# Patient Record
Sex: Male | Born: 2000 | Race: Black or African American | Hispanic: No | Marital: Single | Smoking: Current some day smoker
Health system: Southern US, Community
[De-identification: ages and names within clinical notes are randomized; demographics above are authoritative.]

## PROBLEM LIST (undated history)

## (undated) DIAGNOSIS — F329 Major depressive disorder, single episode, unspecified: Secondary | ICD-10-CM

## (undated) DIAGNOSIS — F419 Anxiety disorder, unspecified: Secondary | ICD-10-CM

## (undated) HISTORY — PX: HERNIA REPAIR: SHX51

---
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
## 2018-07-27 ENCOUNTER — Emergency Department (HOSPITAL_COMMUNITY)
Admission: EM | Admit: 2018-07-27 | Discharge: 2018-07-27 | Disposition: A | Discharge status: Home / Self Care | Payer: 59 | Attending: Emergency Medicine | Admitting: Emergency Medicine

## 2018-07-27 ENCOUNTER — Other Ambulatory Visit: Payer: Self-pay

## 2018-07-27 ENCOUNTER — Emergency Department (HOSPITAL_COMMUNITY): Payer: 59

## 2018-07-27 ENCOUNTER — Encounter (HOSPITAL_COMMUNITY): Payer: Self-pay | Admitting: Emergency Medicine

## 2018-07-27 DIAGNOSIS — I4891 Unspecified atrial fibrillation: Secondary | ICD-10-CM | POA: Diagnosis not present

## 2018-07-27 DIAGNOSIS — R197 Diarrhea, unspecified: Secondary | ICD-10-CM | POA: Insufficient documentation

## 2018-07-27 DIAGNOSIS — F1099 Alcohol use, unspecified with unspecified alcohol-induced disorder: Secondary | ICD-10-CM | POA: Diagnosis not present

## 2018-07-27 DIAGNOSIS — R112 Nausea with vomiting, unspecified: Secondary | ICD-10-CM

## 2018-07-27 DIAGNOSIS — Y900 Blood alcohol level of less than 20 mg/100 ml: Secondary | ICD-10-CM | POA: Insufficient documentation

## 2018-07-27 DIAGNOSIS — R1084 Generalized abdominal pain: Secondary | ICD-10-CM

## 2018-07-27 DIAGNOSIS — I480 Paroxysmal atrial fibrillation: Secondary | ICD-10-CM | POA: Diagnosis not present

## 2018-07-27 DIAGNOSIS — R0602 Shortness of breath: Secondary | ICD-10-CM | POA: Diagnosis present

## 2018-07-27 DIAGNOSIS — F17200 Nicotine dependence, unspecified, uncomplicated: Secondary | ICD-10-CM | POA: Diagnosis not present

## 2018-07-27 LAB — RAPID URINE DRUG SCREEN, HOSP PERFORMED
Amphetamines: NOT DETECTED
Barbiturates: NOT DETECTED
Benzodiazepines: NOT DETECTED
Cocaine: NOT DETECTED
Opiates: NOT DETECTED
Tetrahydrocannabinol: POSITIVE — AB

## 2018-07-27 LAB — COMPREHENSIVE METABOLIC PANEL
ALT: 22 U/L (ref 0–44)
AST: 29 U/L (ref 15–41)
Albumin: 4.4 g/dL (ref 3.5–5.0)
Alkaline Phosphatase: 59 U/L (ref 38–126)
Anion gap: 9 (ref 5–15)
BUN: 11 mg/dL (ref 6–20)
CO2: 24 mmol/L (ref 22–32)
Calcium: 9.2 mg/dL (ref 8.9–10.3)
Chloride: 107 mmol/L (ref 98–111)
Creatinine, Ser: 1.12 mg/dL (ref 0.61–1.24)
GFR calc Af Amer: >60 mL/min (ref >60)
GFR calc non Af Amer: >60 mL/min (ref >60)
Glucose, Bld: 108 mg/dL — ABNORMAL HIGH (ref 70–99)
Potassium: 4 mmol/L (ref 3.5–5.1)
SODIUM: 140 mmol/L (ref 135–145)
Total Bilirubin: 1.6 mg/dL — ABNORMAL HIGH (ref 0.3–1.2)
Total Protein: 7.3 g/dL (ref 6.5–8.1)

## 2018-07-27 LAB — URINALYSIS, ROUTINE W REFLEX MICROSCOPIC
Bilirubin Urine: NEGATIVE
GLUCOSE, UA: NEGATIVE mg/dL
Hgb urine dipstick: NEGATIVE
Ketones, ur: 5 mg/dL — AB
Leukocytes,Ua: NEGATIVE
Nitrite: NEGATIVE
Protein, ur: NEGATIVE mg/dL
Specific Gravity, Urine: >1.046 — ABNORMAL HIGH (ref 1.005–1.030)
pH: 9 — ABNORMAL HIGH (ref 5.0–8.0)

## 2018-07-27 LAB — ETHANOL: Alcohol, Ethyl (B): 11 mg/dL — ABNORMAL HIGH (ref <10)

## 2018-07-27 LAB — CBC
HCT: 43.9 % (ref 39.0–52.0)
Hemoglobin: 13.9 g/dL (ref 13.0–17.0)
MCH: 25.6 pg — ABNORMAL LOW (ref 26.0–34.0)
MCHC: 31.7 g/dL (ref 30.0–36.0)
MCV: 80.8 fL (ref 80.0–100.0)
PLATELETS: 239 10*3/uL (ref 150–400)
RBC: 5.43 MIL/uL (ref 4.22–5.81)
RDW: 13.9 % (ref 11.5–15.5)
WBC: 6.8 10*3/uL (ref 4.0–10.5)
nRBC: 0 % (ref 0.0–0.2)

## 2018-07-27 LAB — D-DIMER, QUANTITATIVE: D-Dimer, Quant: <0.27 ug/mL-FEU (ref 0.00–0.50)

## 2018-07-27 LAB — LIPASE, BLOOD: Lipase: 26 U/L (ref 11–51)

## 2018-07-27 MED ORDER — IOPAMIDOL (ISOVUE-370) INJECTION 76%
100.0000 mL | Freq: Once | INTRAVENOUS | Status: AC | PRN
  Administered 2018-07-27: 60 mL via INTRAVENOUS

## 2018-07-27 MED ORDER — SODIUM CHLORIDE 0.9 % IV BOLUS
1000.0000 mL | Freq: Once | INTRAVENOUS | Status: AC
  Administered 2018-07-27: 1000 mL via INTRAVENOUS

## 2018-07-27 MED ORDER — ONDANSETRON HCL 4 MG/2ML IJ SOLN
4.0000 mg | Freq: Once | INTRAMUSCULAR | Status: AC
  Administered 2018-07-27: 4 mg via INTRAVENOUS
  Filled 2018-07-27: qty 2

## 2018-07-27 MED ORDER — IOPAMIDOL (ISOVUE-300) INJECTION 61%
100.0000 mL | Freq: Once | INTRAVENOUS | Status: AC | PRN
  Administered 2018-07-27: 100 mL via INTRAVENOUS

## 2018-07-27 MED ORDER — SODIUM CHLORIDE 0.9% FLUSH: 3.0000 mL | Freq: Once | INTRAVENOUS | Status: DC

## 2018-07-27 MED ORDER — ONDANSETRON 4 MG PO TBDP: 4.0000 mg | ORAL_TABLET | Freq: Three times a day (TID) | ORAL | 0 refills | Status: DC | PRN

## 2018-07-27 NOTE — ED Notes (Signed)
Delay in lab draw pt not in room at this time 

## 2018-07-27 NOTE — Discharge Instructions (Signed)
You were seen in the ER for nausea, vomiting abdominal pain.  CT abdomen/pelvis showed mild inflammation of colon likely from alcohol intake or virus.  Use zofran for nausea as needed. Stay hydrated.  Return for fever, persistent vomiting despite nausea medicine, blood in stools.   Your heart went in and out of irregular rhythm, this is called atrial fibrillation.  This resolved with IV fluids.  This is likely from alcohol intake.  Cardiology has cleared you. Return for chest pain, palpitations, light-headedness, passing out, heart rate greater than 115-120s.

## 2018-07-27 NOTE — Consult Note (Addendum)
Cardiology Consultation:   Patient ID: DONTAVION EVEN MRN: [id number]; DOB: 07/27/2000  Admit date: 07/27/2018 Date of Consult: 07/27/2018  Primary Care Provider: Deatra James, MD Primary Cardiologist: New  Primary Electrophysiologist:  None    Patient Profile:   Patrick [name] is a 18 y.o. male with a hx of anxiety and depression who is being seen today for the evaluation of PAF in the setting of EtOH, marijuana, N/V at the request of Dr. Blinda Leatherwood.  History of Present Illness:   Patrick [name] is a 18 year old male with past medical history of anxiety and depression who presented this morning after waking up with nausea, vomiting, abdominal pain and found to have atrial fibrillation.  His alcohol level was mildly elevated.  He drank some 4 lokos last night and smoked some blunt as well.  Work-up in the ED has been largely unremarkable.  Specific gravity was elevated.  Urine drug screen was positive for marijuana.  CT angiogram of the chest was negative for PE.  CT of abdomen showed under distention of the colon versus mild colitis.  While in the emergency room, he self converted after IV hydration.  Vital signs are stable afterward.  He denies any further palpitation.   Past Medical History:  Diagnosis Date  . Anxiety   . Depressive disorder 10/10/2015    Past Surgical History:  Procedure Laterality Date  . HERNIA REPAIR       Home Medications:  Prior to Admission medications   Not on File    Inpatient Medications: Scheduled Meds: . sodium chloride flush  3 mL Intravenous Once   Continuous Infusions:  PRN Meds:   Allergies:    Allergies  Allergen Reactions  . Bee Venom Anaphylaxis    unknown    Social History:   Social History   Socioeconomic History  . Marital status: Single    Spouse name: Not on file  . Number of children: Not on file  . Years of education: Not on file  . Highest education level: Not on file  Occupational History  . Not on file  Social Needs    . Financial resource strain: Not on file  . Food insecurity:    Worry: Not on file    Inability: Not on file  . Transportation needs:    Medical: Not on file    Non-medical: Not on file  Tobacco Use  . Smoking status: Current Some Day Smoker  . Smokeless tobacco: Never Used  Substance and Sexual Activity  . Alcohol use: Yes  . Drug use: Yes    Types: Marijuana  . Sexual activity: Yes  Lifestyle  . Physical activity:    Days per week: Not on file    Minutes per session: Not on file  . Stress: Not on file  Relationships  . Social connections:    Talks on phone: Not on file    Gets together: Not on file    Attends religious service: Not on file    Active member of club or organization: Not on file    Attends meetings of clubs or organizations: Not on file    Relationship status: Not on file  . Intimate partner violence:    Fear of current or ex partner: Not on file    Emotionally abused: Not on file    Physically abused: Not on file    Forced sexual activity: Not on file  Other Topics Concern  . Not on file  Social History Narrative  .  Not on file    Family History:    Family History  Problem Relation Age of Onset  . Heart failure Maternal Grandmother   . Atrial fibrillation Maternal Grandmother   . Heart attack Other        maternal uncle had MI at age 68     ROS:  Please see the history of present illness.   All other ROS reviewed and negative.     Physical Exam/Data:   Vitals:   07/27/18 0701 07/27/18 0704 07/27/18 0712 07/27/18 0720  BP: (!) 125/95     Pulse:      Resp:  18 (!) 21 17  SpO2:        Intake/Output Summary (Last 24 hours) at 07/27/2018 1001 Last data filed at 07/27/2018 0729 Gross per 24 hour  Intake 1531.8 ml  Output 300 ml  Net 1231.8 ml   No flowsheet data found.   There is no height or weight on file to calculate BMI.  General:  Well nourished, well developed, in no acute distress HEENT: normal Lymph: no adenopathy Neck: no  JVD Endocrine:  No thryomegaly Vascular: No carotid bruits; FA pulses 2+ bilaterally without bruits  Cardiac:  normal S1, S2; RRR; no murmur  Lungs:  clear to auscultation bilaterally, no wheezing, rhonchi or rales  Abd: soft, nontender, no hepatomegaly  Ext: no edema Musculoskeletal:  No deformities, BUE and BLE strength normal and equal Skin: warm and dry  Neuro:  CNs 2-12 intact, no focal abnormalities noted Psych:  Normal affect   EKG:  The EKG was personally reviewed and demonstrates: Atrial fibrillation Telemetry:  Telemetry was personally reviewed and demonstrates: Atrial fibrillation converted to sinus rhythm  Relevant CV Studies:  CTA of chest 07/27/2018 FINDINGS: Cardiovascular: Pulmonary arterial opacification is excellent. There are no pulmonary emboli. Heart size is normal. Proximal brachiocephalic vessels do not show any abnormality. No sign of coronary artery calcification. Vessel calibers all appear normal.  Mediastinum/Nodes: Normal  Lungs/Pleura: Normal  Upper Abdomen: Normal  Musculoskeletal: Mild spinal curvature, probably not significant. No focal bone finding.  Review of the MIP images confirms the above findings.  IMPRESSION: Normal study. No evidence of cardiovascular pathology by CT. Lungs and pleural spaces appear normal. No acute chest wall finding.  Laboratory Data:  Chemistry Recent Labs  Lab 07/27/18 0315  NA 140  K 4.0  CL 107  CO2 24  GLUCOSE 108*  BUN 11  CREATININE 1.12  CALCIUM 9.2  GFRNONAA >60  GFRAA >60  ANIONGAP 9    Recent Labs  Lab 07/27/18 0315  PROT 7.3  ALBUMIN 4.4  AST 29  ALT 22  ALKPHOS 59  BILITOT 1.6*   Hematology Recent Labs  Lab 07/27/18 0315  WBC 6.8  RBC 5.43  HGB 13.9  HCT 43.9  MCV 80.8  MCH 25.6*  MCHC 31.7  RDW 13.9  PLT 239   Cardiac EnzymesNo results for input(s): TROPONINI in the last 168 hours. No results for input(s): TROPIPOC in the last 168 hours.  BNPNo results for  input(s): BNP, PROBNP in the last 168 hours.  DDimer  Recent Labs  Lab 07/27/18 0529  DDIMER <0.27    Radiology/Studies:  Ct Angio Chest Pe W And/or Wo Contrast  Result Date: 07/27/2018 CLINICAL DATA:  Central chest pain radiating to the back. Febrile. New onset atrial fibrillation. EXAM: CT ANGIOGRAPHY CHEST WITH CONTRAST TECHNIQUE: Multidetector CT imaging of the chest was performed using the standard protocol during bolus administration of intravenous  contrast. Multiplanar CT image reconstructions and MIPs were obtained to evaluate the vascular anatomy. CONTRAST:  60mL ISOVUE-370 IOPAMIDOL (ISOVUE-370) INJECTION 76% COMPARISON:  None. FINDINGS: Cardiovascular: Pulmonary arterial opacification is excellent. There are no pulmonary emboli. Heart size is normal. Proximal brachiocephalic vessels do not show any abnormality. No sign of coronary artery calcification. Vessel calibers all appear normal. Mediastinum/Nodes: Normal Lungs/Pleura: Normal Upper Abdomen: Normal Musculoskeletal: Mild spinal curvature, probably not significant. No focal bone finding. Review of the MIP images confirms the above findings. IMPRESSION: Normal study. No evidence of cardiovascular pathology by CT. Lungs and pleural spaces appear normal. No acute chest wall finding. Electronically Signed   By: Paulina Fusi M.D.   On: 07/27/2018 07:22   Ct Abdomen Pelvis W Contrast  Result Date: 07/27/2018 CLINICAL DATA:  18 year old male with vomiting and nausea. EXAM: CT ABDOMEN AND PELVIS WITH CONTRAST TECHNIQUE: Multidetector CT imaging of the abdomen and pelvis was performed using the standard protocol following bolus administration of intravenous contrast. CONTRAST:  ISOVUE-300 IOPAMIDOL (ISOVUE-300) INJECTION 61% COMPARISON:  None. FINDINGS: Lower chest: The visualized lung bases are clear. No intra-abdominal free air or free fluid. Hepatobiliary: The liver is unremarkable. There is mild periportal edema. The gallbladder is  unremarkable. Pancreas: Unremarkable. No pancreatic ductal dilatation or surrounding inflammatory changes. Spleen: Normal in size without focal abnormality. Adrenals/Urinary Tract: The adrenal glands are unremarkable. There is no hydronephrosis on either side. The urinary bladder is unremarkable. Stomach/Bowel: Evaluation of the bowel is limited in the absence of oral contrast and due to paucity of mesenteric fat. There is no evidence of bowel obstruction. Diffusely thickened appearance of the colon likely related to underdistention. Correlation with clinical exam recommended to exclude colitis. The visualized portion of the appendix appears unremarkable. Vascular/Lymphatic: The abdominal aorta and IVC appear unremarkable. No portal venous gas. There is no adenopathy. Reproductive: The prostate and seminal vesicles are grossly unremarkable. No pelvic mass. Other: None Musculoskeletal: No acute or significant osseous findings. IMPRESSION: Underdistention of the colon versus mild colitis. Clinical correlation is recommended. No bowel obstruction. Electronically Signed   By: Elgie Collard M.D.   On: 07/27/2018 06:05    Assessment and Plan:   1. Paroxysmal atrial fibrillation: Occurred in the setting of nausea, vomiting, marijuana use and alcohol intake.  CHA2DS2-Vasc score is 0.  No further work-up is necessary.  In younger patients, this type of paroxysmal atrial fibrillation usually resolved by itself within 24 to 48 hours with IV hydration.  Since he has already done so, I would recommend discharge without any further medical treatment.  Advised on avoidance of heavy alcohol intake.  2. Nausea/vomiting: CT of abdomen is negative.  Seen by hospitalist group and the felt nausea is related to marijuana use.  CT of abdomen showed no significant finding.   [facility] will sign off.   Medication Recommendations:  None Other recommendations (labs, testing, etc):  Avoid alcohol Follow up as an  outpatient:  None with cardiology, followup with PCP  For questions or updates, please contact [facility] Please consult [email or web address] for contact info under     Signed, Azalee Course, [place]  07/27/2018 10:01 AM   Personally seen and examined. Agree with above.   18 year old male after a night of alcohol with caffeine, marijuana with nausea vomiting had atrial fibrillation paroxysmal.  Spontaneously converted on his own.  Feeling well now, no chest pain.  No fevers chills nausea vomiting currently  EKG personally reviewed shows atrial fibrillation Telemetry personally reviewed shows normal  sinus rhythm currently  CT angiogram of chest shows no heart abnormalities, no PE, no dissection.  GEN: Well nourished, well developed, in no acute distress  HEENT: normal  Neck: no JVD, carotid bruits, or masses Cardiac: RRR; no murmurs, rubs, or gallops,no edema  Respiratory:  clear to auscultation bilaterally, normal work of breathing GI: soft, nontender, nondistended, + BS MS: no deformity or atrophy  Skin: warm and dry, no rash Neuro:  Alert and Oriented x 3, Strength and sensation are intact Psych: euthymic mood, full affect  Assessment and plan:  Paroxysmal atrial fibrillation in the setting of alcohol and caffeine - Spontaneously converted.  Discussed with him the pathophysiology surrounding his event.  No murmurs heard on exam.  EKG personally reviewed.  He may be discharged from the emergency department.  Primary care physician follow-up.  Avoid alcohol and caffeine.  Donato Schultz, MD

## 2018-07-27 NOTE — Consult Note (Signed)
Patrick [name] Medical Consultation  Patrick [name] OYD:[id number] DOB: 22-Aug-2000 DOA: 07/27/2018 PCP: Patrick James, MD   Requesting physician: Patrick [name] [facility]  Date of consultation: 07/27/2018 Reason for consultation: nausea, vomiting and Afib   Impression/Recommendations Active Problems:   New onset a-fib (HCC)   Nausea & vomiting   1. Nausea vomiting and epigastric pain following of binge alcohol drink and marijuana use: Suspect alcohol-related gastritis or dyspepsia. Also possible marijuana induced hyperemesis. Patient treated with IV fluids and nausea medications and currently asymptomatic.  He is hungry.  CT scan of the abdomen pelvis with no significant findings.  His abdomen examination is benign.  Will advance to regular diet and monitor.  Received 2 L IV fluids in the ER.  Electrolytes are normal.  2. New onset atrial fibrillation: Patient noted to have A. fib with variable heart rate after episodes of nausea and vomiting.  After IV fluid resuscitation, he is currently sinus rhythm in monitor.  Denies any chest pain, shortness of breath, palpitations.  Denies any history of palpitations or shortness of breath or chest pain.    Since patient is currently asymptomatic, I have requested emergency room physician to discuss case with cardiology and if they think he can safely be discharged home with outpatient follow-up to discharge home. If cardiology recommend monitoring regarding A. fib, will admit. He does not need admission regarding his GI symptoms as they have already improved.  Chief Complaint: Nausea vomiting  HPI: Patient is 18 year old with no medical history presented the emergency room with onset of upper abdominal pain nausea and multiple episodes of vomiting since last night.  Pain is mostly in the upper abdomen and sometimes all over the abdomen that was followed by multiple episodes of nonbilious vomiting.  This all started after drinking alcohol last night.  Had normal bowel  movement in the morning.  Urination is normal.  No fever chills. Denies any chest pain, palpitations, shortness of breath.  Denies any anginal symptoms.  Denies any weight gain or weight loss.  Denies any similar symptoms in the past. In the emergency room, hemodynamically stable.  He was found tachycardic.  EKG shows atrial fibrillation with rate controlled.  After given antinausea medications and IV fluids, he is asymptomatic.  Abdominal pain and nausea improved.  He wants to eat.  Lipase is normal.  Alcohol level is less than 10.  Electrolytes are normal.  D-dimer was normal.  CT scan of the abdomen pelvis did not show any evidence of intestinal obstruction or any mass or inflammation, suspected colitis which could be from underdistention.   Review of Systems:  All 10 review of systems done.  Pertinent positive as above and rest are negative.  Past Medical History:  Diagnosis Date  . Anxiety   . Depressive disorder 10/10/2015   Past Surgical History:  Procedure Laterality Date  . HERNIA REPAIR     Social History:  reports that he has been smoking. He has never used smokeless tobacco. He reports current alcohol use. He reports current drug use. Drug: Marijuana.  Allergies  Allergen Reactions  . Bee Venom Anaphylaxis    unknown   No family history on file. No family history of coronary artery disease, premature cardiac death. Prior to Admission medications   Not on File   Physical Exam: Blood pressure (!) 125/95, pulse 76, resp. rate 17, SpO2 100 %. Vitals:   07/27/18 0712 07/27/18 0720  BP:    Pulse:    Resp: (!) 21 17  SpO2:      Physical Exam  Constitutional: He is oriented to person, place, and time and well-developed, well-nourished, and in no distress.  HENT:  Head: Normocephalic.  Eyes: Pupils are equal, round, and reactive to light.  Neck: Normal range of motion. Neck supple.  Cardiovascular: Normal rate, regular rhythm and normal heart sounds. Exam reveals no  gallop and no friction rub.  No murmur heard. Pulmonary/Chest: Effort normal and breath sounds normal.  Abdominal: Soft. Bowel sounds are normal.  Musculoskeletal: Normal range of motion.        General: No tenderness or edema.  Neurological: He is alert and oriented to person, place, and time.  Skin: Skin is warm and dry. No rash noted.  Psychiatric: Affect and judgment normal.    Labs on Admission:  Basic Metabolic Panel: Recent Labs  Lab 07/27/18 0315  NA 140  K 4.0  CL 107  CO2 24  GLUCOSE 108*  BUN 11  CREATININE 1.12  CALCIUM 9.2   Liver Function Tests: Recent Labs  Lab 07/27/18 0315  AST 29  ALT 22  ALKPHOS 59  BILITOT 1.6*  PROT 7.3  ALBUMIN 4.4   Recent Labs  Lab 07/27/18 0315  LIPASE 26   No results for input(s): AMMONIA in the last 168 hours. CBC: Recent Labs  Lab 07/27/18 0315  WBC 6.8  HGB 13.9  HCT 43.9  MCV 80.8  PLT 239   Cardiac Enzymes: No results for input(s): CKTOTAL, CKMB, CKMBINDEX, TROPONINI in the last 168 hours. BNP: Invalid input(s): POCBNP CBG: No results for input(s): GLUCAP in the last 168 hours.  Radiological Exams on Admission: Ct Angio Chest Pe W And/or Wo Contrast  Result Date: 07/27/2018 CLINICAL DATA:  Central chest pain radiating to the back. Febrile. New onset atrial fibrillation. EXAM: CT ANGIOGRAPHY CHEST WITH CONTRAST TECHNIQUE: Multidetector CT imaging of the chest was performed using the standard protocol during bolus administration of intravenous contrast. Multiplanar CT image reconstructions and MIPs were obtained to evaluate the vascular anatomy. CONTRAST:  60mL ISOVUE-370 IOPAMIDOL (ISOVUE-370) INJECTION 76% COMPARISON:  None. FINDINGS: Cardiovascular: Pulmonary arterial opacification is excellent. There are no pulmonary emboli. Heart size is normal. Proximal brachiocephalic vessels do not show any abnormality. No sign of coronary artery calcification. Vessel calibers all appear normal. Mediastinum/Nodes:  Normal Lungs/Pleura: Normal Upper Abdomen: Normal Musculoskeletal: Mild spinal curvature, probably not significant. No focal bone finding. Review of the MIP images confirms the above findings. IMPRESSION: Normal study. No evidence of cardiovascular pathology by CT. Lungs and pleural spaces appear normal. No acute chest wall finding. Electronically Signed   By: Paulina FusiMark  Shogry M.D.   On: 07/27/2018 07:22   Ct Abdomen Pelvis W Contrast  Result Date: 07/27/2018 CLINICAL DATA:  18 year old male with vomiting and nausea. EXAM: CT ABDOMEN AND PELVIS WITH CONTRAST TECHNIQUE: Multidetector CT imaging of the abdomen and pelvis was performed using the standard protocol following bolus administration of intravenous contrast. CONTRAST:  100mL ISOVUE-300 IOPAMIDOL (ISOVUE-300) INJECTION 61% COMPARISON:  None. FINDINGS: Lower chest: The visualized lung bases are clear. No intra-abdominal free air or free fluid. Hepatobiliary: The liver is unremarkable. There is mild periportal edema. The gallbladder is unremarkable. Pancreas: Unremarkable. No pancreatic ductal dilatation or surrounding inflammatory changes. Spleen: Normal in size without focal abnormality. Adrenals/Urinary Tract: The adrenal glands are unremarkable. There is no hydronephrosis on either side. The urinary bladder is unremarkable. Stomach/Bowel: Evaluation of the bowel is limited in the absence of oral contrast and due to paucity of mesenteric  fat. There is no evidence of bowel obstruction. Diffusely thickened appearance of the colon likely related to underdistention. Correlation with clinical exam recommended to exclude colitis. The visualized portion of the appendix appears unremarkable. Vascular/Lymphatic: The abdominal aorta and IVC appear unremarkable. No portal venous gas. There is no adenopathy. Reproductive: The prostate and seminal vesicles are grossly unremarkable. No pelvic mass. Other: None Musculoskeletal: No acute or significant osseous findings.  IMPRESSION: Underdistention of the colon versus mild colitis. Clinical correlation is recommended. No bowel obstruction. Electronically Signed   By: Elgie Collard M.D.   On: 07/27/2018 06:05    EKG: Independently reviewed. Afib with ventricular rate of 94  Time spent: 25 minutes   Dorcas Carrow Patrick [name] Pager [phone]  If 7PM-7AM, please contact night-coverage [email or web address] Password [facility] 07/27/2018, 9:03 AM

## 2018-07-27 NOTE — ED Notes (Signed)
Pt spontaneously converted back to NSR. EKG captured and exported to chart. MD aware.

## 2018-07-27 NOTE — ED Provider Notes (Signed)
[facility] EMERGENCY DEPARTMENT Provider Note   CSN: [id number] Arrival date & time: 07/27/18  0251    History   Chief Complaint Chief Complaint  Patient presents with  . Abdominal Pain  . Emesis  . Diarrhea    HPI Patrick [name] is a 18 y.o. male.     18 y.o male with PMH of Hernia repair presents to the ED with a chief complaint of abdominal pain and vomiting x today. Patient describes his pain as sharp all over his abdomen region worse after eating. He reports multiple episodes of non bilious, non bloody emesis at least five times since arrival to the ED. He also endorses some nausea along with shortness of breath. He states " feel like I have to poop but nothing is coming out", he is currently passing flatus. He reports his last BM was yesterday morning. He reports taking some alcohol tonight along with a four lokos.He denies any chest pain, fever or diarrhea.      Past Medical History:  Diagnosis Date  . Anxiety   . Depressive disorder 10/10/2015    Patient Active Problem List   Diagnosis Date Noted  . Intermittent explosive disorder 10/10/2015  . Depressive disorder 10/10/2015  . DMDD (disruptive mood dysregulation disorder) (HCC) 10/09/2015  . Cannabis use disorder, moderate, dependence (HCC) 10/09/2015    Past Surgical History:  Procedure Laterality Date  . HERNIA REPAIR          Home Medications    Prior to Admission medications   Not on File    Family History No family history on file.  Social History Social History   Tobacco Use  . Smoking status: Current Some Day Smoker  . Smokeless tobacco: Never Used  Substance Use Topics  . Alcohol use: Yes  . Drug use: Yes    Types: Marijuana     Allergies   Bee venom   Review of Systems Review of Systems  Constitutional: Negative for chills and fever.  HENT: Negative for ear pain and sore throat.   Eyes: Negative for pain and visual disturbance.  Respiratory: Positive for  shortness of breath. Negative for cough.   Cardiovascular: Negative for chest pain and palpitations.  Gastrointestinal: Positive for abdominal pain, nausea and vomiting. Negative for diarrhea.  Genitourinary: Negative for dysuria and hematuria.  Musculoskeletal: Negative for arthralgias and back pain.  Skin: Negative for color change and rash.  Neurological: Negative for seizures and syncope.  All other systems reviewed and are negative.    Physical Exam Updated Vital Signs BP (!) 125/95   Pulse 76   Resp 17   SpO2 100%   Physical Exam Vitals signs and nursing note reviewed.  Constitutional:      Appearance: He is well-developed.  HENT:     Head: Normocephalic and atraumatic.  Eyes:     General: No scleral icterus.    Pupils: Pupils are equal, round, and reactive to light.  Neck:     Musculoskeletal: Normal range of motion.  Cardiovascular:     Rate and Rhythm: Tachycardia present.     Heart sounds: Normal heart sounds.  Pulmonary:     Effort: Pulmonary effort is normal.     Breath sounds: Normal breath sounds. No wheezing.  Chest:     Chest wall: No tenderness.  Abdominal:     General: Bowel sounds are absent. There is no distension.     Palpations: Abdomen is soft.     Tenderness: There  is generalized abdominal tenderness. There is right CVA tenderness and guarding. There is no rebound. Negative signs include Murphy's sign and Rovsing's sign.     Hernia: No hernia is present.  Musculoskeletal:        General: No tenderness or deformity.  Skin:    General: Skin is warm and dry.  Neurological:     Mental Status: He is alert and oriented to person, place, and time.      ED Treatments / Results  Labs (all labs ordered are listed, but only abnormal results are displayed) Labs Reviewed  COMPREHENSIVE METABOLIC PANEL - Abnormal; Notable for the following components:      Result Value   Glucose, Bld 108 (*)    Total Bilirubin 1.6 (*)    All other components  within normal limits  CBC - Abnormal; Notable for the following components:   MCH 25.6 (*)    All other components within normal limits  ETHANOL - Abnormal; Notable for the following components:   Alcohol, Ethyl (B) 11 (*)    All other components within normal limits  LIPASE, BLOOD  D-DIMER, QUANTITATIVE (NOT AT [facility])  URINALYSIS, ROUTINE W REFLEX MICROSCOPIC  RAPID URINE DRUG SCREEN, HOSP PERFORMED    EKG EKG Interpretation  Date/Time:  Monday July 27 2018 04:41:11 EST Ventricular Rate:  94 PR Interval:    QRS Duration: 75 QT Interval:  359 QTC Calculation: 449 R Axis:   84 Text Interpretation:  Atrial fibrillation Borderline T wave abnormalities Confirmed by Gilda Crease [phone]) on 07/27/2018 4:49:59 AM   Radiology Ct Angio Chest Pe W And/or Wo Contrast  Result Date: 07/27/2018 CLINICAL DATA:  Central chest pain radiating to the back. Febrile. New onset atrial fibrillation. EXAM: CT ANGIOGRAPHY CHEST WITH CONTRAST TECHNIQUE: Multidetector CT imaging of the chest was performed using the standard protocol during bolus administration of intravenous contrast. Multiplanar CT image reconstructions and MIPs were obtained to evaluate the vascular anatomy. CONTRAST:  39mL ISOVUE-370 IOPAMIDOL (ISOVUE-370) INJECTION 76% COMPARISON:  None. FINDINGS: Cardiovascular: Pulmonary arterial opacification is excellent. There are no pulmonary emboli. Heart size is normal. Proximal brachiocephalic vessels do not show any abnormality. No sign of coronary artery calcification. Vessel calibers all appear normal. Mediastinum/Nodes: Normal Lungs/Pleura: Normal Upper Abdomen: Normal Musculoskeletal: Mild spinal curvature, probably not significant. No focal bone finding. Review of the MIP images confirms the above findings. IMPRESSION: Normal study. No evidence of cardiovascular pathology by CT. Lungs and pleural spaces appear normal. No acute chest wall finding. Electronically Signed   By: Paulina Fusi M.D.   On: 07/27/2018 07:22   Ct Abdomen Pelvis W Contrast  Result Date: 07/27/2018 CLINICAL DATA:  18 year old male with vomiting and nausea. EXAM: CT ABDOMEN AND PELVIS WITH CONTRAST TECHNIQUE: Multidetector CT imaging of the abdomen and pelvis was performed using the standard protocol following bolus administration of intravenous contrast. CONTRAST:  ISOVUE-300 IOPAMIDOL (ISOVUE-300) INJECTION 61% COMPARISON:  None. FINDINGS: Lower chest: The visualized lung bases are clear. No intra-abdominal free air or free fluid. Hepatobiliary: The liver is unremarkable. There is mild periportal edema. The gallbladder is unremarkable. Pancreas: Unremarkable. No pancreatic ductal dilatation or surrounding inflammatory changes. Spleen: Normal in size without focal abnormality. Adrenals/Urinary Tract: The adrenal glands are unremarkable. There is no hydronephrosis on either side. The urinary bladder is unremarkable. Stomach/Bowel: Evaluation of the bowel is limited in the absence of oral contrast and due to paucity of mesenteric fat. There is no evidence of bowel obstruction. Diffusely thickened appearance  of the colon likely related to underdistention. Correlation with clinical exam recommended to exclude colitis. The visualized portion of the appendix appears unremarkable. Vascular/Lymphatic: The abdominal aorta and IVC appear unremarkable. No portal venous gas. There is no adenopathy. Reproductive: The prostate and seminal vesicles are grossly unremarkable. No pelvic mass. Other: None Musculoskeletal: No acute or significant osseous findings. IMPRESSION: Underdistention of the colon versus mild colitis. Clinical correlation is recommended. No bowel obstruction. Electronically Signed   By: Elgie Collard M.D.   On: 07/27/2018 06:05    Procedures Procedures (including critical care time)  Medications Ordered in ED Medications  sodium chloride flush (NS) 0.9 % injection 3 mL (3 mLs Intravenous Not  Given 07/27/18 0320)  sodium chloride 0.9 % bolus 1,000 mL (0 mLs Intravenous Stopped 07/27/18 0628)  ondansetron (ZOFRAN) injection 4 mg (4 mg Intravenous Given 07/27/18 0456)  iopamidol (ISOVUE-300) 61 % injection 100 mL (100 mLs Intravenous Contrast Given 07/27/18 0515)  iopamidol (ISOVUE-370) 76 % injection 100 mL (60 mLs Intravenous Contrast Given 07/27/18 8657)     Initial Impression / Assessment and Plan / ED Course  I have reviewed the triage vital signs and the nursing notes.  Pertinent labs & imaging results that were available during my care of the patient were reviewed by me and considered in my medical decision making (see chart for details).       Patient presents with abdominal pain which intensified after eating, does have a previous history of multiple hernia repairs. Patient had multiple episode of vomiting in ED room. He reports taking some four lokos along with "took a couple of hits of a blunt". During evaluation patient was highly uncomfortable reports generalized abdominal pain worse with palpation.   This patients CHA2DS2-VASc= 0  CBC showed no leukocytosis, hemoglobin within normal limits. CMP showed no electrolyte abnormalities. Lipase is within normal limits. During patient's interview HR is varying from 50-120 with changes while changing position. EKG was obtained which showed Afib with RVR.He denies any previous history of these symptoms, he is currently not on any medication for rate control.   CT Abdomen showed: Underdistention of the colon versus mild colitis. Clinical  correlation is recommended. No bowel obstruction.     8469 Spoke to Dr. Jerral Ralph who advised patient needs cardiology consult. 7:28 AM Spoke to patient and mother informed on results, will hand off care to incoming provider pending cards consult and their recommendations.    Final Clinical Impressions(s) / ED Diagnoses   Final diagnoses:  New onset a-fib [facility])  Generalized abdominal pain     ED Discharge Orders    None       Claude Manges, PA-C 07/27/18 0730    Gilda Crease, MD 08/10/18 [phone]

## 2018-07-27 NOTE — ED Triage Notes (Signed)
C/o generalized abd pain, nausea, vomiting, and diarrhea x 3 hours.  PT reports drinking ETOH tonight.

## 2018-07-27 NOTE — ED Provider Notes (Signed)
Patrick [name]: pt handed off to me by previous EDPA at shift change pending cardiology consult. See previous note for full details. Briefly pt is here for nausea, vomiting, abdominal pain, generalized abdominal pain in setting of ETOH and marijuana use.  Noted to be tachycardic and EKG confirmed atrial fibrillation with RVR. Extensive work up including CT AP, CTA for PE and labs obtained and remarkable for possible colitis, ETOH 11. Repeat EKG shows irregularly irregular rhythm with improved HR 100s.  He has gotten 1 L IVF and zofran so far.  Previous team spoke to Dr Jerral Ralph (hospitalist). Cards consult pending.  CHA2DS2VASc = 0.  Currently asymptomatic. Will give an additional 1 L IVF.  Physical Exam  BP 115/74   Pulse 88   Resp (!) 23   SpO2 100%     ED Course/Procedures   Clinical Course as of Jul 27 1041  Mon Jul 27, 2018  3785 Spoke to Brent Georgia with cardiology. Pt has been added to their list.    [CG]    Clinical Course User Index [CG] Liberty Handy, PA-C    Procedures  MDM   1040: Patient evaluated by cardiology who suspects paroxysmal atrial fibrillation from EtOH intake.  After additional liter IV fluids patient self converted.  Asymptomatic.  Tolerating fluids.  Deemed appropriate for discharge without further interventions by cardiology.  Patient also seen by Dr. Jerral Ralph, appreciate his assessment.  Dc with zofran, oral hydration, return precautions.       Liberty Handy, PA-C 07/27/18 1044    Gilda Crease, MD 07/28/18 [phone]

## 2018-07-27 NOTE — ED Notes (Signed)
Patient transported to CT 

## 2018-07-28 ENCOUNTER — Telehealth (HOSPITAL_COMMUNITY): Payer: Self-pay | Admitting: *Deleted

## 2018-07-28 NOTE — Telephone Encounter (Signed)
LMOM for pt to call re f/u appt.  Seen in ED for afib

## 2018-12-05 ENCOUNTER — Encounter (HOSPITAL_COMMUNITY): Payer: Self-pay

## 2018-12-05 ENCOUNTER — Emergency Department (HOSPITAL_COMMUNITY)
Admission: EM | Admit: 2018-12-05 | Discharge: 2018-12-05 | Disposition: A | Discharge status: Home / Self Care | Payer: 59 | Attending: Emergency Medicine | Admitting: Emergency Medicine

## 2018-12-05 ENCOUNTER — Other Ambulatory Visit: Payer: Self-pay

## 2018-12-05 DIAGNOSIS — W228XXA Striking against or struck by other objects, initial encounter: Secondary | ICD-10-CM | POA: Diagnosis not present

## 2018-12-05 DIAGNOSIS — S81812A Laceration without foreign body, left lower leg, initial encounter: Secondary | ICD-10-CM | POA: Diagnosis not present

## 2018-12-05 DIAGNOSIS — Y999 Unspecified external cause status: Secondary | ICD-10-CM | POA: Diagnosis not present

## 2018-12-05 DIAGNOSIS — S8992XA Unspecified injury of left lower leg, initial encounter: Secondary | ICD-10-CM | POA: Diagnosis present

## 2018-12-05 DIAGNOSIS — Y929 Unspecified place or not applicable: Secondary | ICD-10-CM | POA: Insufficient documentation

## 2018-12-05 DIAGNOSIS — Y9302 Activity, running: Secondary | ICD-10-CM | POA: Diagnosis not present

## 2018-12-05 MED ORDER — IBUPROFEN 800 MG PO TABS: 800.0000 mg | ORAL_TABLET | Freq: Once | ORAL | Status: DC

## 2018-12-05 MED ORDER — LIDOCAINE HCL (PF) 1 % IJ SOLN
5.0000 mL | Freq: Once | INTRAMUSCULAR | Status: AC
  Administered 2018-12-05: 03:00:00 5 mL via INTRADERMAL
  Filled 2018-12-05: qty 30

## 2018-12-05 MED ORDER — CEPHALEXIN 500 MG PO CAPS: 500.0000 mg | ORAL_CAPSULE | Freq: Four times a day (QID) | ORAL | 0 refills | Status: DC

## 2018-12-05 MED ORDER — IBUPROFEN 200 MG PO TABS
600.0000 mg | ORAL_TABLET | Freq: Once | ORAL | Status: AC
  Administered 2018-12-05: 04:00:00 600 mg via ORAL
  Filled 2018-12-05: qty 3

## 2018-12-05 NOTE — ED Triage Notes (Signed)
Pt reports L leg pain. Abrasion noted to L shin. Pt states that it hurts to put pressure on that leg. He states that he fell, but doesn't remember what exactly happened. Denies head injury.

## 2018-12-05 NOTE — ED Provider Notes (Signed)
[place] DEPT Provider Note   CSN: [id number] Arrival date & time: 12/05/18  0140     History   Chief Complaint Chief Complaint  Patient presents with  . Leg Pain    HPI Patrick [name] is a 18 y.o. male.     Patient is a 18 year old male presenting with a left leg laceration.  Patient states that he was running when he struck his leg on an unknown object.  He has a laceration to his shin.  He denies any numbness or tingling.  Patient's last tetanus shot was unknown.  The history is provided by the patient.  Leg Pain Location:  Leg Time since incident:  3 hours Injury: yes   Leg location:  L leg Pain details:    Radiates to:  Does not radiate   Severity:  Moderate   Onset quality:  Sudden   History reviewed. No pertinent past medical history.  There are no active problems to display for this patient.         Home Medications    Prior to Admission medications   Not on File    Family History History reviewed. No pertinent family history.  Social History Social History   Tobacco Use  . Smoking status: Not on file  Substance Use Topics  . Alcohol use: Not on file  . Drug use: Not on file     Allergies   Patient has no known allergies.   Review of Systems Review of Systems  All other systems reviewed and are negative.    Physical Exam Updated Vital Signs BP 108/75 (BP Location: Left Arm)   Pulse 94   Temp 98.7 F (37.1 C) (Oral)   Resp 16   SpO2 98%   Physical Exam Vitals signs and nursing note reviewed.  Constitutional:      General: He is not in acute distress.    Appearance: Normal appearance. He is not ill-appearing.  HENT:     Head: Normocephalic.  Musculoskeletal:     Comments: There is a 2.5 cm, deep laceration to the anterior aspect of the left shin.  Bleeding is controlled.  Patient is able to dorsiflex his ankle without difficulty.  DP pulses are easily palpable and motor and sensation are  intact through the entire foot.  Skin:    General: Skin is warm and dry.  Neurological:     Mental Status: He is alert.      ED Treatments / Results  Labs (all labs ordered are listed, but only abnormal results are displayed) Labs Reviewed - No data to display  EKG None  Radiology No results found.  Procedures Procedures (including critical care time)  Medications Ordered in ED Medications  lidocaine (PF) (XYLOCAINE) 1 % injection 5 mL (has no administration in time range)     Initial Impression / Assessment and Plan / ED Course  I have reviewed the triage vital signs and the nursing notes.  Pertinent labs & imaging results that were available during my care of the patient were reviewed by me and considered in my medical decision making (see chart for details).  Patient is a 19 year old male with a left leg laceration.  He injured it while running.  He will offer no further details on how the injury occurred.  Laceration closed with stitches as below.  Patient to apply local wound care and have stitches removed in 10 days.  Patient will also be given Keflex as this is a somewhat  deep laceration.  LACERATION REPAIR Performed by: Geoffery Lyonsouglas Piercen Covino Authorized by: Geoffery Lyonsouglas Dearl Rudden Consent: Verbal consent obtained. Risks and benefits: risks, benefits and alternatives were discussed Consent given by: patient Patient identity confirmed: provided demographic data Prepped and Draped in normal sterile fashion Wound explored  Laceration Location: Left leg  Laceration Length: 2.5 cm  No Foreign Bodies seen or palpated  Anesthesia: local infiltration  Local anesthetic: lidocaine 1 % without epinephrine  Anesthetic total: 5 ml  Irrigation method: syringe Amount of cleaning: standard  Skin closure: 3-0 Ethilon  Number of sutures: 4  Technique: Simple interrupted  Patient tolerance: Patient tolerated the procedure well with no immediate complications.   Final Clinical  Impressions(s) / ED Diagnoses   Final diagnoses:  None    ED Discharge Orders    None       Geoffery Lyonselo, Telesforo Brosnahan, MD 12/05/18 [phone]

## 2018-12-05 NOTE — Discharge Instructions (Addendum)
Local wound care with bacitracin and dressing changes twice daily.  Wrap loosely with an Ace bandage for comfort and compression.  Begin taking Keflex as prescribed this evening for prevention of infection.  Sutures are to be removed in 10 days.  Please follow-up with your primary doctor for this.  Return to the emergency department in the meantime if you develop increased redness, pain, pus draining from the wound, or other new and concerning symptoms.

## 2018-12-07 ENCOUNTER — Encounter (HOSPITAL_COMMUNITY): Payer: Self-pay | Admitting: Emergency Medicine

## 2019-04-08 ENCOUNTER — Emergency Department (HOSPITAL_COMMUNITY)
Admission: EM | Admit: 2019-04-08 | Discharge: 2019-04-09 | Disposition: A | Discharge status: Other Acute Inpatient Hospital | Payer: 59 | Source: Home / Self Care | Attending: Emergency Medicine | Admitting: Emergency Medicine

## 2019-04-08 ENCOUNTER — Encounter (HOSPITAL_COMMUNITY): Payer: Self-pay | Admitting: Clinical

## 2019-04-08 DIAGNOSIS — F329 Major depressive disorder, single episode, unspecified: Secondary | ICD-10-CM | POA: Insufficient documentation

## 2019-04-08 DIAGNOSIS — Z046 Encounter for general psychiatric examination, requested by authority: Secondary | ICD-10-CM | POA: Insufficient documentation

## 2019-04-08 DIAGNOSIS — Z8659 Personal history of other mental and behavioral disorders: Secondary | ICD-10-CM | POA: Insufficient documentation

## 2019-04-08 DIAGNOSIS — R45851 Suicidal ideations: Secondary | ICD-10-CM

## 2019-04-08 DIAGNOSIS — F913 Oppositional defiant disorder: Secondary | ICD-10-CM | POA: Diagnosis not present

## 2019-04-08 DIAGNOSIS — Z720 Tobacco use: Secondary | ICD-10-CM | POA: Insufficient documentation

## 2019-04-08 DIAGNOSIS — F332 Major depressive disorder, recurrent severe without psychotic features: Secondary | ICD-10-CM | POA: Diagnosis not present

## 2019-04-08 DIAGNOSIS — Z20828 Contact with and (suspected) exposure to other viral communicable diseases: Secondary | ICD-10-CM | POA: Insufficient documentation

## 2019-04-08 LAB — COMPREHENSIVE METABOLIC PANEL
ALT: 15 U/L (ref 0–44)
AST: 18 U/L (ref 15–41)
Albumin: 4.3 g/dL (ref 3.5–5.0)
Alkaline Phosphatase: 67 U/L (ref 38–126)
Anion gap: 8 (ref 5–15)
BUN: 12 mg/dL (ref 6–20)
CO2: 26 mmol/L (ref 22–32)
Calcium: 9.9 mg/dL (ref 8.9–10.3)
Chloride: 104 mmol/L (ref 98–111)
Creatinine, Ser: 1.09 mg/dL (ref 0.61–1.24)
GFR calc Af Amer: >60 mL/min (ref >60)
GFR calc non Af Amer: >60 mL/min (ref >60)
Glucose, Bld: 92 mg/dL (ref 70–99)
Potassium: 4.3 mmol/L (ref 3.5–5.1)
Sodium: 138 mmol/L (ref 135–145)
Total Bilirubin: 0.9 mg/dL (ref 0.3–1.2)
Total Protein: 7.3 g/dL (ref 6.5–8.1)

## 2019-04-08 LAB — RAPID URINE DRUG SCREEN, HOSP PERFORMED
Amphetamines: NOT DETECTED
Barbiturates: NOT DETECTED
Benzodiazepines: NOT DETECTED
Cocaine: NOT DETECTED
Opiates: NOT DETECTED
Tetrahydrocannabinol: POSITIVE — AB

## 2019-04-08 LAB — CBC WITH DIFFERENTIAL/PLATELET
Abs Immature Granulocytes: 0.01 10*3/uL (ref 0.00–0.07)
Basophils Absolute: 0.1 10*3/uL (ref 0.0–0.1)
Basophils Relative: 1 %
Eosinophils Absolute: 0.2 10*3/uL (ref 0.0–0.5)
Eosinophils Relative: 6 %
HCT: 46.1 % (ref 39.0–52.0)
Hemoglobin: 15 g/dL (ref 13.0–17.0)
Immature Granulocytes: 0 %
Lymphocytes Relative: 29 %
Lymphs Abs: 1 10*3/uL (ref 0.7–4.0)
MCH: 26.8 pg (ref 26.0–34.0)
MCHC: 32.5 g/dL (ref 30.0–36.0)
MCV: 82.5 fL (ref 80.0–100.0)
Monocytes Absolute: 0.4 10*3/uL (ref 0.1–1.0)
Monocytes Relative: 11 %
Neutro Abs: 1.9 10*3/uL (ref 1.7–7.7)
Neutrophils Relative %: 53 %
Platelets: 250 10*3/uL (ref 150–400)
RBC: 5.59 MIL/uL (ref 4.22–5.81)
RDW: 13.8 % (ref 11.5–15.5)
WBC: 3.6 10*3/uL — ABNORMAL LOW (ref 4.0–10.5)
nRBC: 0 % (ref 0.0–0.2)

## 2019-04-08 LAB — SARS CORONAVIRUS 2 (TAT 6-24 HRS): SARS Coronavirus 2: NEGATIVE

## 2019-04-08 LAB — ACETAMINOPHEN LEVEL: Acetaminophen (Tylenol), Serum: <10 ug/mL — ABNORMAL LOW (ref 10–30)

## 2019-04-08 LAB — ETHANOL: Alcohol, Ethyl (B): <10 mg/dL (ref <10)

## 2019-04-08 LAB — SALICYLATE LEVEL: Salicylate Lvl: <7 mg/dL (ref 2.8–30.0)

## 2019-04-08 MED ORDER — ZOLPIDEM TARTRATE 5 MG PO TABS: 5.0000 mg | ORAL_TABLET | Freq: Every evening | ORAL | Status: DC | PRN

## 2019-04-08 MED ORDER — ALUM & MAG HYDROXIDE-SIMETH 200-200-20 MG/5ML PO SUSP: 30.0000 mL | Freq: Four times a day (QID) | ORAL | Status: DC | PRN

## 2019-04-08 MED ORDER — ONDANSETRON HCL 4 MG PO TABS: 4.0000 mg | ORAL_TABLET | Freq: Three times a day (TID) | ORAL | Status: DC | PRN

## 2019-04-08 MED ORDER — ACETAMINOPHEN 325 MG PO TABS: 650.0000 mg | ORAL_TABLET | ORAL | Status: DC | PRN

## 2019-04-08 NOTE — ED Notes (Signed)
Dinner tray has been provided. 

## 2019-04-08 NOTE — ED Notes (Signed)
Secretary faxed documents to behavioral health

## 2019-04-08 NOTE — ED Notes (Signed)
Pt's belongings in locker 5 in [facility] [place]

## 2019-04-08 NOTE — ED Notes (Signed)
Dinner tray has been has been ordered.

## 2019-04-08 NOTE — BH Assessment (Addendum)
Patient accepted to [facility]. Room #306-1. Nursing report [phone]. The accepting provider is Priscille Loveless, NP. The attending provider is Dr. Mallie Darting.

## 2019-04-08 NOTE — ED Notes (Signed)
Pt. Has been accepted to [facility] to room 306 Bed #1. Accepting Provider Jefferson Fuel, NP and attending provider Dr. Mallie Darting.   Report can be given to [phone]

## 2019-04-08 NOTE — ED Triage Notes (Signed)
Pt to ED via [employer] , Per pt, pt mother called [employer] and told them he threatened to jump off bridge. Pt denies saying this, reports his mother made it up. Currently denies SI/HI/AVH. Appears to be minimizing. Reports hx of "anger problems" Reports smoking mariajuana, denies other drug use .

## 2019-04-08 NOTE — ED Notes (Signed)
Pt changed into maroon scrub top and wanded

## 2019-04-08 NOTE — ED Provider Notes (Signed)
MSE was initiated and I personally evaluated the patient and placed orders (if any) at  11:52 AM on April 08, 2019.  The patient appears stable so that the remainder of the MSE may be completed by another provider.  Patient brought in by [employer] after he called his mother saying he was going to jump off a bridge.  She contacted the police who found him at the edge of a bridge threatening to jump.  The patient was ultimately restrained and brought to the emergency department for evaluation.  I was asked to see the patient to put in an IVC in place until he can be psychiatrically evaluated.  IVC filled out.   Hayden Rasmussen, MD 04/08/19 [phone]

## 2019-04-08 NOTE — BHH Counselor (Signed)
Attempting to reach [place] for telepsych.

## 2019-04-08 NOTE — BH Assessment (Addendum)
Tele Assessment Note   Patient Name: Patrick Merlesoah A Garman MRN: [id number] Referring Physician: Charm BargesButler [facility] Location of Provider: Behavioral Health TTS Department  Patrick [name] is an 18 y.o. male [facility] via [employer]. [employer] initiated IVC. Patient renders conflicting history from collateral. Patient states that he just moved back in with his mother 1 week ago and they have been arguing a lot. He states that today he "wanted to make her feel bad" so he called her and told her was going to jump off a bridge. Patient states that he did not mean it. Patient denies SI/HI/AVH. He denies any depressive symptoms. Patient admits to hospitalization as a teenager for his "anger." Patient reports no drug use due to being on probation. He reports he last THC use was 2 months ago, however his UDS is positive. Patient denies any trauma history or family history of mental illness. Patient states that he has several current criminal charges "Mostly for drugs but some for guns" and is currently on probation. Patient gave verbal consent for TTS to contact his mother, Patrick [name] at [phone].  Per patient's mother: Patient recently moved back in with her. He was living in [address]" with friends but when he could not stay there any longer he began to sleep in his car. Since patient has moved home he has made several statements about being suicidal including "I don't care about my life" and "I don't care if I live or die anymore." Today she states they got into an argument over the phone and he stated "I'm going to walk into traffic with you on the phone right now so you hear me get hit by a car or truck." At that time she contacted police. When the police arrived patient was standing on the ledge of a bridge near their home screaming he was going to jump. Patient was restrained by police and brought to hospital.   Patient is alert and oriented x 4. He is dressed in burgundy scrubs. His  speech is logical, eye contact is good, and his thoughts are organized. Patient's mood is anxious and his affect is congruent. His insight, judgement, and impulse control are impaired. He does not appear to be responding to internal stimuli or experiencing delusional thought content.   Diagnosis: F33.2 MDD, recurrent, severe   F63.81 IED  Past Medical History:  Past Medical History:  Diagnosis Date  . Anxiety   . Depressive disorder 10/10/2015    Past Surgical History:  Procedure Laterality Date  . HERNIA REPAIR      Family History:  Family History  Problem Relation Age of Onset  . Heart failure Maternal Grandmother   . Atrial fibrillation Maternal Grandmother   . Heart attack Other        maternal uncle had MI at age 20    Social History:  reports that he has been smoking. He has never used smokeless tobacco. He reports current alcohol use. He reports current drug use. Drug: Marijuana.  Additional Social History:  Alcohol / Drug Use Pain Medications: see MAR Prescriptions: see MAR Over the Counter: see MAR History of alcohol / drug use?: Yes Substance #1 Name of Substance 1: THC 1 - Age of First Use: teens 1 - Amount (size/oz): varies 1 - Frequency: daily 1 - Duration: UTA 1 - Last Use / Amount: 2 months ago  CIWA: CIWA-Ar BP: 113/73 Pulse Rate: 73 COWS:    Allergies:  Allergies  Allergen  Reactions  . Bee Venom Anaphylaxis    unknown    Home Medications: (Not in a hospital admission)   OB/GYN Status:  No LMP for male patient.  General Assessment Data Location of Assessment: [facility] ED TTS Assessment: In system Is this a Tele or Face-to-Face Assessment?: Tele Assessment Is this an Initial Assessment or a Re-assessment for this encounter?: Initial Assessment Patient Accompanied by:: N/A Language Other than English: No Living Arrangements: (with mother) What gender do you identify as?: Male Marital status: Single Maiden name: Milligan Pregnancy Status:  No Living Arrangements: Parent Can pt return to current living arrangement?: Yes Admission Status: Involuntary Petitioner: Police Is patient capable of signing voluntary admission?: No Referral Source: Self/Family/Friend Insurance type: [facility]     Crisis Care Plan Living Arrangements: Parent Legal Guardian: (self) Name of Psychiatrist: none Name of Therapist: none  Education Status Is patient currently in school?: No Is the patient employed, unemployed or receiving disability?: Employed  Risk to self with the past 6 months Suicidal Ideation: Yes-Currently Present Has patient been a risk to self within the past 6 months prior to admission? : Yes Suicidal Intent: Yes-Currently Present Has patient had any suicidal intent within the past 6 months prior to admission? : Yes Is patient at risk for suicide?: Yes Suicidal Plan?: Yes-Currently Present Has patient had any suicidal plan within the past 6 months prior to admission? : Yes Specify Current Suicidal Plan: jump off a bridge Access to Means: Yes Specify Access to Suicidal Means: bridge near his home What has been your use of drugs/alcohol within the last 12 months?: denies Previous Attempts/Gestures: No How many times?: 0 Other Self Harm Risks: none noted Triggers for Past Attempts: Family contact Intentional Self Injurious Behavior: None Family Suicide History: No Recent stressful life event(s): Legal Issues, Financial Problems Persecutory voices/beliefs?: No Depression: Yes Depression Symptoms: Despondent, Feeling angry/irritable, Feeling worthless/self pity, Loss of interest in usual pleasures, Isolating Substance abuse history and/or treatment for substance abuse?: No Suicide prevention information given to non-admitted patients: Not applicable  Risk to Others within the past 6 months Homicidal Ideation: No Does patient have any lifetime risk of violence toward others beyond the six months prior to admission? :  No Thoughts of Harm to Others: No Current Homicidal Intent: No Current Homicidal Plan: No Access to Homicidal Means: No Identified Victim: none History of harm to others?: No Assessment of Violence: None Noted Violent Behavior Description: none Does patient have access to weapons?: No Criminal Charges Pending?: Yes Describe Pending Criminal Charges: drug and gun related charges Does patient have a court date: Yes Court Date: (04/07/2019 (missed)) Is patient on probation?: Yes  Psychosis Hallucinations: None noted Delusions: None noted  Mental Status Report Appearance/Hygiene: Unremarkable Eye Contact: Good Motor Activity: Freedom of movement Speech: Logical/coherent Level of Consciousness: Alert Mood: Pleasant Affect: Appropriate to circumstance Anxiety Level: Minimal Thought Processes: Coherent, Relevant Judgement: Impaired Orientation: Person, Place, Time, Situation Obsessive Compulsive Thoughts/Behaviors: None  Cognitive Functioning Concentration: Normal Memory: Recent Intact, Remote Intact Is patient IDD: No Insight: Poor Impulse Control: Poor Appetite: Good Have you had any weight changes? : No Change Sleep: Decreased Total Hours of Sleep: 4 Vegetative Symptoms: None  ADLScreening [facility] Assessment Services) Patient's cognitive ability adequate to safely complete daily activities?: Yes Patient able to express need for assistance with ADLs?: Yes Independently performs ADLs?: Yes (appropriate for developmental age)  Prior Inpatient Therapy Prior Inpatient Therapy: Yes Prior Therapy Dates: 2017 Prior Therapy Facilty/Provider(s): [facility] Reason for Treatment: DMDD  Prior Outpatient Therapy Prior Outpatient Therapy: Yes Prior Therapy Dates: 2017 Prior Therapy Facilty/Provider(s): [facility] Reason for Treatment: "anger" Does patient have an ACCT team?: No Does patient have Intensive In-House Services?  : No Does patient have [facility] services? : No Does patient  have P4CC services?: No  ADL Screening (condition at time of admission) Patient's cognitive ability adequate to safely complete daily activities?: Yes Is the patient deaf or have difficulty hearing?: No Does the patient have difficulty seeing, even when wearing glasses/contacts?: No Does the patient have difficulty concentrating, remembering, or making decisions?: No Patient able to express need for assistance with ADLs?: Yes Does the patient have difficulty dressing or bathing?: No Independently performs ADLs?: Yes (appropriate for developmental age) Does the patient have difficulty walking or climbing stairs?: No Weakness of Legs: None Weakness of Arms/Hands: None  Home Assistive Devices/Equipment Home Assistive Devices/Equipment: None  Therapy Consults (therapy consults require a physician order) PT Evaluation Needed: No OT Evalulation Needed: No SLP Evaluation Needed: No Abuse/Neglect Assessment (Assessment to be complete while patient is alone) Abuse/Neglect Assessment Can Be Completed: Yes Physical Abuse: Denies Verbal Abuse: Denies Sexual Abuse: Denies Exploitation of patient/patient's resources: Denies Self-Neglect: Denies Values / Beliefs Cultural Requests During Hospitalization: None Spiritual Requests During Hospitalization: None Consults Spiritual Care Consult Needed: No Social Work Consult Needed: No Merchant navy officer (For Healthcare) Does Patient Have a Medical Advance Directive?: No          Disposition: Malachy Chamber, PMHNP recommends in patient treatment. Herbert Seta, [facility] reviewing for placement at [facility]. Neldon Labella, RN notified. Disposition Initial Assessment Completed for this Encounter: Yes  This service was provided via telemedicine using a 2-way, interactive audio and video technology.  Names of all persons participating in this telemedicine service and their role in this encounter. Name: Durel Salts Role: patient  Name: Celedonio Miyamoto, LCSW Role: TTS   Name: Role:   Name:  Role:     Celedonio Miyamoto 04/08/2019 3:24 PM

## 2019-04-08 NOTE — ED Provider Notes (Signed)
[facility] EMERGENCY DEPARTMENT Provider Note   CSN: [id number] Arrival date & time: 04/08/19  1109     History   Chief Complaint Chief Complaint  Patient presents with  . Suicidal    HPI Patrick [name] is a 18 y.o. male.     The history is provided by the patient and the police. No language interpreter was used.     18 year old male with hx of anxiety, depression, brought here via [employer] for reported suicidal ideation.  Per note, patient's mother called [employer] and told him that patient threatened to jump off a bridge.  Patient denies trying to harm himself.  He admits that he had an argument with his mom this morning and wanted to leave the house to go to the city.  He reported he has had argument with his mom recently but did not go into details.  He denies any injury.  He denies any homicidal ideation, auditory or visual hallucination.  He denies taking any medication regular basis, denies any alcohol or tobacco use.  Does report smoking marijuana on occasion.  IVC papers filed.  Patient denies any prior suicidal attempt.  He denies feeling depressed.  Past Medical History:  Diagnosis Date  . Anxiety   . Depressive disorder 10/10/2015    Patient Active Problem List   Diagnosis Date Noted  . New onset a-fib (HCC) 07/27/2018  . Nausea & vomiting 07/27/2018  . Intermittent explosive disorder 10/10/2015  . Depressive disorder 10/10/2015  . DMDD (disruptive mood dysregulation disorder) (HCC) 10/09/2015  . Cannabis use disorder, moderate, dependence (HCC) 10/09/2015    Past Surgical History:  Procedure Laterality Date  . HERNIA REPAIR          Home Medications    Prior to Admission medications   Medication Sig Start Date End Date Taking? Authorizing Provider  cephALEXin (KEFLEX) 500 MG capsule Take 1 capsule (500 mg total) by mouth 4 (four) times daily. 12/05/18   Geoffery Lyons, MD  ondansetron (ZOFRAN ODT) 4 MG disintegrating tablet Take 1 tablet (4 mg  total) by mouth every 8 (eight) hours as needed for nausea or vomiting. 07/27/18   Liberty Handy, PA-C    Family History Family History  Problem Relation Age of Onset  . Heart failure Maternal Grandmother   . Atrial fibrillation Maternal Grandmother   . Heart attack Other        maternal uncle had MI at age 37    Social History Social History   Tobacco Use  . Smoking status: Current Some Day Smoker  . Smokeless tobacco: Never Used  Substance Use Topics  . Alcohol use: Yes  . Drug use: Yes    Types: Marijuana     Allergies   Bee venom   Review of Systems Review of Systems  All other systems reviewed and are negative.    Physical Exam Updated Vital Signs BP 113/73 (BP Location: Right Arm)   Pulse 73   Temp 98.1 F (36.7 C) (Oral)   Resp 18   Ht 5\' 10"  (1.778 m)   Wt 61.7 kg   SpO2 100%   BMI 19.51 kg/m   Physical Exam Vitals signs and nursing note reviewed.  Constitutional:      General: He is not in acute distress.    Appearance: He is well-developed.  HENT:     Head: Atraumatic.  Eyes:     Conjunctiva/sclera: Conjunctivae normal.  Neck:     Musculoskeletal: Neck  supple.  Cardiovascular:     Rate and Rhythm: Normal rate and regular rhythm.     Pulses: Normal pulses.     Heart sounds: Normal heart sounds.  Pulmonary:     Effort: Pulmonary effort is normal.     Breath sounds: Normal breath sounds.  Abdominal:     Palpations: Abdomen is soft.     Tenderness: There is no abdominal tenderness.  Skin:    Findings: No rash.  Neurological:     Mental Status: He is alert and oriented to person, place, and time.     GCS: GCS eye subscore is 4. GCS verbal subscore is 5. GCS motor subscore is 6.  Psychiatric:        Attention and Perception: Attention normal.        Mood and Affect: Affect is flat.        Speech: Speech normal.        Behavior: Behavior is cooperative.        Thought Content: Thought content does not include homicidal or suicidal  ideation.      ED Treatments / Results  Labs (all labs ordered are listed, but only abnormal results are displayed) Labs Reviewed  RAPID URINE DRUG SCREEN, HOSP PERFORMED - Abnormal; Notable for the following components:      Result Value   Tetrahydrocannabinol POSITIVE (*)    All other components within normal limits  CBC WITH DIFFERENTIAL/PLATELET - Abnormal; Notable for the following components:   WBC 3.6 (*)    All other components within normal limits  ACETAMINOPHEN LEVEL - Abnormal; Notable for the following components:   Acetaminophen (Tylenol), Serum <10 (*)    All other components within normal limits  SARS CORONAVIRUS 2 (TAT 6-24 HRS)  COMPREHENSIVE METABOLIC PANEL  ETHANOL  SALICYLATE LEVEL    EKG None  Radiology No results found.  Procedures Procedures (including critical care time)  Medications Ordered in ED Medications  acetaminophen (TYLENOL) tablet 650 mg (has no administration in time range)  zolpidem (AMBIEN) tablet 5 mg (has no administration in time range)  ondansetron (ZOFRAN) tablet 4 mg (has no administration in time range)  alum & mag hydroxide-simeth (MAALOX/MYLANTA) 200-200-20 MG/5ML suspension 30 mL (has no administration in time range)     Initial Impression / Assessment and Plan / ED Course  I have reviewed the triage vital signs and the nursing notes.  Pertinent labs & imaging results that were available during my care of the patient were reviewed by me and considered in my medical decision making (see chart for details).  Clinical Course as of Apr 07 1299  Thu Apr 08, 2019  [id number] 18 [facility].  Patient awake and alert.  Neuro intact.  Getting screening labs.  Placed on IVC.  Will need behavioral health consult   [MB]    Clinical Course User Index [MB] Terrilee FilesButler, Michael C, MD       BP 113/73 (BP Location: Right Arm)   Pulse 73   Temp 98.1 F (36.7 C) (Oral)   Resp 18   Ht 5\' 10"  (1.778  m)   Wt 61.7 kg   SpO2 100%   BMI 19.51 kg/m    Final Clinical Impressions(s) / ED Diagnoses   Final diagnoses:  Suicidal ideation    ED Discharge Orders    None     1:17 PM Patient brought here due to report of mom stating patient threatened to jump off a bridge.  Patient denies this.  He does have history of depression anxiety.  He appears to minimize the situation during exam.  He is in no acute discomfort.  He is medically cleared, will consult psych for further management.     Domenic Moras, PA-C 04/08/19 1545    Hayden Rasmussen, MD 04/08/19 Lurena Nida

## 2019-04-08 NOTE — BHH Counselor (Signed)
Disposition: Priscille Loveless, PMHNP recommends in patient treatment. Nira Conn, [facility] reviewing for placement at [facility]. Lowella Petties, RN notified.

## 2019-04-08 NOTE — ED Notes (Signed)
Report Given to [place], Therapist, sports at [employer]

## 2019-04-08 NOTE — ED Notes (Signed)
Turkey sandwich given to patient

## 2019-04-09 ENCOUNTER — Inpatient Hospital Stay (HOSPITAL_COMMUNITY)
Admission: AD | Admit: 2019-04-09 | Discharge: 2019-04-11 | DRG: 885 | Disposition: A | Discharge status: Home / Self Care | Payer: 59 | Source: Intra-hospital | Attending: Psychiatry | Admitting: Psychiatry

## 2019-04-09 ENCOUNTER — Other Ambulatory Visit: Payer: Self-pay

## 2019-04-09 ENCOUNTER — Encounter (HOSPITAL_COMMUNITY): Payer: Self-pay

## 2019-04-09 DIAGNOSIS — F332 Major depressive disorder, recurrent severe without psychotic features: Principal | ICD-10-CM

## 2019-04-09 DIAGNOSIS — F411 Generalized anxiety disorder: Secondary | ICD-10-CM | POA: Diagnosis not present

## 2019-04-09 DIAGNOSIS — Z20828 Contact with and (suspected) exposure to other viral communicable diseases: Secondary | ICD-10-CM | POA: Diagnosis present

## 2019-04-09 DIAGNOSIS — G4700 Insomnia, unspecified: Secondary | ICD-10-CM | POA: Diagnosis present

## 2019-04-09 DIAGNOSIS — F419 Anxiety disorder, unspecified: Secondary | ICD-10-CM

## 2019-04-09 DIAGNOSIS — F6381 Intermittent explosive disorder: Secondary | ICD-10-CM | POA: Diagnosis present

## 2019-04-09 DIAGNOSIS — F17290 Nicotine dependence, other tobacco product, uncomplicated: Secondary | ICD-10-CM | POA: Diagnosis present

## 2019-04-09 DIAGNOSIS — F12288 Cannabis dependence with other cannabis-induced disorder: Secondary | ICD-10-CM | POA: Diagnosis not present

## 2019-04-09 DIAGNOSIS — F913 Oppositional defiant disorder: Secondary | ICD-10-CM | POA: Diagnosis present

## 2019-04-09 DIAGNOSIS — R45851 Suicidal ideations: Secondary | ICD-10-CM | POA: Diagnosis present

## 2019-04-09 DIAGNOSIS — F1220 Cannabis dependence, uncomplicated: Secondary | ICD-10-CM | POA: Diagnosis present

## 2019-04-09 DIAGNOSIS — F329 Major depressive disorder, single episode, unspecified: Secondary | ICD-10-CM | POA: Diagnosis present

## 2019-04-09 MED ORDER — OLANZAPINE 5 MG PO TBDP
5.0000 mg | ORAL_TABLET | ORAL | Status: AC
  Administered 2019-04-09: 12:00:00 5 mg via ORAL
  Filled 2019-04-09: qty 1

## 2019-04-09 MED ORDER — ALUM & MAG HYDROXIDE-SIMETH 200-200-20 MG/5ML PO SUSP: 30.0000 mL | ORAL | Status: DC | PRN

## 2019-04-09 MED ORDER — LORAZEPAM 1 MG PO TABS: 1.0000 mg | ORAL_TABLET | ORAL | Status: DC | PRN

## 2019-04-09 MED ORDER — TRAZODONE HCL 50 MG PO TABS
50.0000 mg | ORAL_TABLET | Freq: Every evening | ORAL | Status: DC | PRN
  Administered 2019-04-09 – 2019-04-10 (×3): 50 mg via ORAL
  Filled 2019-04-09 (×3): qty 1

## 2019-04-09 MED ORDER — HYDROXYZINE HCL 25 MG PO TABS
25.0000 mg | ORAL_TABLET | Freq: Three times a day (TID) | ORAL | Status: DC | PRN
  Administered 2019-04-09: 02:00:00 25 mg via ORAL
  Filled 2019-04-09: qty 1

## 2019-04-09 MED ORDER — ACETAMINOPHEN 325 MG PO TABS: 650.0000 mg | ORAL_TABLET | Freq: Four times a day (QID) | ORAL | Status: DC | PRN

## 2019-04-09 MED ORDER — SERTRALINE HCL 25 MG PO TABS
25.0000 mg | ORAL_TABLET | Freq: Every day | ORAL | Status: DC
  Administered 2019-04-09 – 2019-04-11 (×3): 25 mg via ORAL
  Filled 2019-04-09 (×6): qty 1

## 2019-04-09 MED ORDER — OLANZAPINE 10 MG PO TBDP: 10.0000 mg | ORAL_TABLET | Freq: Three times a day (TID) | ORAL | Status: DC | PRN

## 2019-04-09 MED ORDER — MAGNESIUM HYDROXIDE 400 MG/5ML PO SUSP: 30.0000 mL | Freq: Every day | ORAL | Status: DC | PRN

## 2019-04-09 MED ORDER — ZIPRASIDONE MESYLATE 20 MG IM SOLR: 20.0000 mg | INTRAMUSCULAR | Status: DC | PRN

## 2019-04-09 MED ORDER — OLANZAPINE 10 MG PO TBDP
10.0000 mg | ORAL_TABLET | Freq: Every day | ORAL | Status: DC
  Administered 2019-04-09 – 2019-04-10 (×2): 10 mg via ORAL
  Filled 2019-04-09 (×4): qty 1

## 2019-04-09 NOTE — BHH Counselor (Signed)
Adult Comprehensive Assessment  Patient ID: Patrick [name], male   DOB: Sep 29, 2000, 18 y.o.   MRN: [id number]  Information Source: Information source: Patient  Current Stressors:  Patient states their primary concerns and needs for treatment are:: "It was a misunderstanding. I wanted to make my mom feel bad." Patient argued with his mother, voiced SI with a plan to jump off a bridge, mother contacted law enforcement and patient was IVC'd Patient states their goals for this hospitilization and ongoing recovery are:: "I just shouldn't have said what I said." Educational / Learning stressors: Denies Employment / Job issues: Started a new job 1 month ago, he really likes his job and he is worried about losing it while inpatient Family Relationships: Strained with mother, not really close to any of his family since his grandma died Museum/gallery curator / Lack of resources (include bankruptcy): Worried about losing his job, has Chief Technology Officer through [employer] / Lack of housing: Recently moved in with mom this week, it's not working out. Physical health (include injuries & life threatening diseases): Denies Social relationships: Gets along great with his GF, reports they just found out she is pregnant Substance abuse: Denies Bereavement / Loss: Grandma, who raised him, died this year. Reports he has also lost 7 or 8 friends this year alone.  Living/Environment/Situation:  Living Arrangements: Parent Living conditions (as described by patient or guardian): Single family home in [place] Who else lives in the home?: Mother How long has patient lived in current situation?: 3 days What is atmosphere in current home: Chaotic  Family History:  Marital status: Long term relationship Long term relationship, how long?: 1 year What types of issues is patient dealing with in the relationship?: Denies Additional relationship information: Just found out GF is pregnant, they are nervous but excited Are  you sexually active?: Yes What is your sexual orientation?: Straight Has your sexual activity been affected by drugs, alcohol, medication, or emotional stress?: denies Does patient have children?: No(GF is pregnant)  Childhood History:  By whom was/is the patient raised?: Mother, Grandparents Additional childhood history information: Mostly raised by grandmother. Mom worked 3 jobs, dad wasn't very involved. Description of patient's relationship with caregiver when they were a child: Close to grandmother, strained with parents Patient's description of current relationship with people who raised him/her: Grandmother died this year, strained with parents How were you disciplined when you got in trouble as a child/adolescent?: Appropriately Does patient have siblings?: Yes Number of Siblings: 3 Description of patient's current relationship with siblings: He doesn't know how many siblings he has on his dad's side (probably more than 12). Brother and sister on his mother's side, they don't talk much anymore. 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 How long has patient been employed?: 1 month Patient's job has been impacted by current illness: No What is the longest time patient has a held a job?: 1 year Where was the patient employed at that time?: [employer] Did You Receive Any Psychiatric Treatment/Services  While in the Military?:  No Are There Guns or Other Weapons in Your Home?: No(Felon)  Financial Resources:   Financial resources: Income from employment, Private insurance Does patient have a representative payee or guardian?: No  Alcohol/Substance Abuse:   What has been your use of drugs/alcohol within the last 12 months?: Denies Alcohol/Substance Abuse Treatment Hx: Past Tx, Inpatient If yes, describe treatment: [facility] on the child unit in 2017 for anger issues Has alcohol/substance abuse ever caused legal problems?: Yes(Felony charges, currently on probation.)  Social Support System:   Patient's Community Support System: Fair Describe Community Support System: GF, some friends Type of faith/religion: None How does patient's faith help to cope with current illness?: n/a  Leisure/Recreation:   Leisure and Hobbies: "I used to sell drugs, now I just work a lot."  Strengths/Needs:   What is the patient's perception of their strengths?: "Be the best at everything. People gravitate toward me." Patient states they can use these personal strengths during their treatment to contribute to their recovery: Yes Patient states these barriers may affect/interfere with their treatment: Worried about losing his job and violating probation while inpatient  Discharge Plan:   Currently receiving community mental health services: No Patient states concerns and preferences for aftercare planning are: Agrees to outpatient therapy and medication management if it will expedite his discharge Patient states they will know when they are safe and ready for discharge when: Feels safe now. Does patient have access to transportation?: Yes Does patient have financial barriers related to discharge medications?: No Patient description of barriers related to discharge medications: Has insurance and income Plan for living situation after discharge: Plans to stay with his GF for a couple of days Will patient be returning to same living  situation after discharge?: No  Summary/Recommendations:   Summary and Recommendations (to be completed by the evaluator): Patrick [name] is a 18 year old male from [place] [facility] [place]), he presents to [facility] under IVC petitioned by his mother for voicing SI. Patient endorses voicing the threat, but states he was not serious and he just wanted to upset his mother. Patient is eager to discharge, minimizes stressors but does endorse conflict with his family, legal issues, and worries about losing his job while inpatient. Patient was previously hospitalized at [facility] on the child unit in 2017 for anger issues and SI. Patient is not established with outpatient providers, but is receptive to referrals. Patient will benefit from crisis stabilization, medication management, therapeutic milieu, and referrals for services.  Patrick [name]. 04/09/2019

## 2019-04-09 NOTE — Progress Notes (Signed)
   04/09/19 2000  Psych Admission Type (Psych Patients Only)  Admission Status Involuntary  Psychosocial Assessment  Patient Complaints Anxiety  Eye Contact Brief  Facial Expression Angry;Animated  Affect Anxious;Angry  Speech Loud  Interaction Defensive  Motor Activity Fidgety  Appearance/Hygiene Disheveled  Behavior Characteristics Anxious  Mood Anxious;Pleasant  Thought Process  Coherency Concrete thinking  Content Blaming others;Compulsions  Delusions WDL  Perception WDL  Hallucination None reported or observed  Judgment Poor  Confusion WDL  Danger to Self  Current suicidal ideation? Denies  Danger to Others  Danger to Others None reported or observed  Danger to Others Abnormal  Harmful Behavior to others No threats or harm toward other people  Destructive Behavior No threats or harm toward property   Pt visible on the unit. Pt continues  To have concrete thinking. Pt wants to get better, but sees everything as Black or White issue and not just an issue. Pt tends to have various sterotypes about various Races that clouds his judgement about issues.

## 2019-04-09 NOTE — Progress Notes (Signed)
Recreation Therapy Notes  Date: 11.6.20 Time: 1000 Location: 500 Hall Dayroom  Group Topic: Communication, Team Building, Problem Solving  Goal Area(s) Addresses:  Patient will effectively work with peer towards shared goal.  Patient will identify skill used to make activity successful.  Patient will identify how skills used during activity can be used to reach post d/c goals.   Behavioral Response:  Engaged  Intervention: STEM Activity   Activity: Metallurgist.  In groups, patients were given 12 pipe cleaner.  Patients were to build a free standing tower as tall as possible using just the pipe cleaner.  Throughout the activity, there were setbacks such as putting one hand behind their backs and not being able to talk.  Education: Education officer, community, Dentist.   Education Outcome: Acknowledges education  Clinical Observations/Feedback:  Pt was active and worked well with his peers.  Pt was appropriate and able to focus on the activity.  Pt was called out of group to meet with doctor.     Victorino Sparrow, LRT/CTRS     Ria Comment, Afsana Liera A 04/09/2019 11:06 AM

## 2019-04-09 NOTE — Progress Notes (Signed)
Adult Psychoeducational Group Note  Date:  04/09/2019 Time:  9:59 PM  Group Topic/Focus:  Wrap-Up Group:   The focus of this group is to help patients review their daily goal of treatment and discuss progress on daily workbooks.  Participation Level:  Active  Participation Quality:  Appropriate  Affect:  Appropriate  Cognitive:  Appropriate  Insight: Appropriate  Engagement in Group:  Developing/Improving  Modes of Intervention:  Discussion  Additional Comments:  Pt stated his goal for today was to focus on his treatment plan. Pt stated he felt he accomplished his goal today. Pt stated his relationship with his family has improved since he was admitted here. Pt stated his mother coming for visitation help improve his over all day. Pt stated he felt better about himself today. Pt rated his over all day a 9 out of 10. Pt stated his appetite was pretty good today. Pt stated he slept pretty good today. Pt stated he was in no physical pain today. Pt stated he was not hearing or seeing anything that was not there. Pt stated he had no thoughts of hurting himself or others. Pt stated if anything change he would alert staff.  Candy Sledge 04/09/2019, 9:59 PM

## 2019-04-09 NOTE — ED Notes (Signed)
Pt contacting mother at this time in attempt to inform her r/t his transport to [facility]

## 2019-04-09 NOTE — Progress Notes (Signed)
Spirituality group facilitated by Simone Curia, MDiv, BCC.  Group Description:  Group focused on topic of hope.  Patients participated in facilitated discussion around topic, connecting with one another around experiences and definitions for hope.  Group members engaged with visual explorer photos, reflecting on what hope looks like for them today.  Group engaged in discussion around how their definitions of hope are present today in hospital.   Modalities: Psycho-social ed, Adlerian, Narrative, MI Patient Progress: Patrick [name] was present throughout group.  Engaged in group discussion - noting that he has to connect "hope" with "action"   He spoke about moments where he had to adjust his thought process to look forward in his life and not be stuck in dwelling on past.  Patrick [name] related that he feels motivation by reflecting on hard times he had experienced and knowing that change is possible.

## 2019-04-09 NOTE — Progress Notes (Signed)
DAR NOTE:   D-Pt is alert and oriented x4. Pt denied having HI/SI/AVH.  Pt presents as being anxious and irritable at times.  He is also animated when he speaks. Pt reports that he is not depressed.    A-RN actively listened to pt describe his feelings.  RN provided support and encouragement.  RN administered pt's medications as prescribed by pt's provider.  Maintained q15 min safety checks.   R-Pt observed interacting with his peers on the unit.  Pt did not have any adverse reactions with medications.  Pt remains safe on the unit. Pt stated that he would let RN know if pt needs assistance.

## 2019-04-09 NOTE — Progress Notes (Signed)
Pt brought in in handcuffs by [employer]. Pt agitated due to a certain police officer and his mother. Writer talked to pt and pt calmed down and was cooperative. The process explained to pt and pt appeared to have a hard time getting the concept that him making a threat to kill himself was serious enough to get him in the hospital. Pt stated he said he would jump off a bridge to make his mother feel bad.

## 2019-04-09 NOTE — BHH Suicide Risk Assessment (Signed)
[facility] Admission Suicide Risk Assessment   Nursing information obtained from:  Patient Demographic factors:  Low socioeconomic status Current Mental Status:  Suicidal ideation indicated by others, Suicide plan Loss Factors:  Legal issues Historical Factors:  Impulsivity Risk Reduction Factors:  Employed, Religious beliefs about death  Total Time spent with patient: 20 minutes Principal Problem: <principal problem not specified> Diagnosis:  Active Problems:   MDD (major depressive disorder)  Subjective Data: Patient is seen and examined.  Patient is a 18 year old male with a reported past psychiatric history significant for intermittent explosive disorder and a previous diagnosis of oppositional defiant disorder who presented to the [facility] emergency department on 04/08/2019 under involuntary commitment.  The [facility] police initiated the involuntary commitment.  The patient stated he has been living in a part of town that is bad, and he needed to get away from there.  He moved back into the home with his mother approximately a week ago.  He and his mother do not get along and they have been arguing.  He told his mother that he was going to go jump off a bridge to "make her feel bad".  He stated he had no intention of killing himself.  He stated that he [place] [facility] [place] to be here.  He stated that by keeping him here it just makes him angry.  He admitted to having problems with anger in the past.  The patient was hospitalized in June 2018 and diagnosed with disruptive mood dysregulation disorder.  He was having anger episodes at that time.  He stated that whenever anyone gets in his way he gets very angry.  His previous psychiatric hospitalizations included medications including Cogentin, olanzapine and trazodone.  He had been scheduled to follow-up with Dr. Darleene Cleaver in the past.  He stated that he had not taken medications after he been discharged.  He stated the  only things he had problems with were drugs and issues with legal problems regarding guns.  He stated today that he was on probation.  His drug screen was positive for marijuana, but no other drugs or alcohol.  He denied suicidal ideation today.  He is upset over the fact that I need collateral information before I can release him.  He was admitted to the hospital for evaluation and stabilization.  Continued Clinical Symptoms:  Alcohol Use Disorder Identification Test Final Score (AUDIT): 3 The "Alcohol Use Disorders Identification Test", Guidelines for Use in Primary Care, Second Edition.  World Pharmacologist [facility]). Score between 0-7:  no or low risk or alcohol related problems. Score between 8-15:  moderate risk of alcohol related problems. Score between 16-19:  high risk of alcohol related problems. Score 20 or above:  warrants further diagnostic evaluation for alcohol dependence and treatment.   CLINICAL FACTORS:   Alcohol/Substance Abuse/Dependencies Personality Disorders:   Cluster B More than one psychiatric diagnosis Unstable or Poor Therapeutic Relationship Previous Psychiatric Diagnoses and Treatments   Musculoskeletal: Strength & Muscle Tone: within normal limits Gait & Station: normal Patient leans: N/A  Psychiatric Specialty Exam: Physical Exam  Nursing note and vitals reviewed. Constitutional: He is oriented to person, place, and time. He appears well-developed and well-nourished.  HENT:  Head: Normocephalic and atraumatic.  Respiratory: Effort normal.  Neurological: He is alert and oriented to person, place, and time.    ROS  Height 5\' 10"  (1.778 m), weight 61.7 kg.Body mass index is 19.51 kg/m.  General Appearance: [employer]  Contact:  Good  Speech:  Pressured  Volume:  Increased  Mood:  Irritable  Affect:  Congruent  Thought Process:  Coherent and Descriptions of Associations: Circumstantial  Orientation:  Full (Time, Place, and Person)  Thought  Content:  Rumination  Suicidal Thoughts:  No  Homicidal Thoughts:  No  Memory:  Immediate;   Fair Recent;   Fair Remote;   Fair  Judgement:  Impaired  Insight:  Lacking  Psychomotor Activity:  Increased  Concentration:  Concentration: Poor and Attention Span: Poor  Recall:  [employer] of Knowledge:  Fair  Language:  Good  Akathisia:  Negative  Handed:  Right  AIMS (if indicated):     Assets:  Desire for Improvement Resilience  ADL's:  Intact  Cognition:  WNL  Sleep:         COGNITIVE FEATURES THAT CONTRIBUTE TO RISK:  Thought constriction (tunnel vision)    SUICIDE RISK:   Minimal: No identifiable suicidal ideation.  Patients presenting with no risk factors but with morbid ruminations; may be classified as minimal risk based on the severity of the depressive symptoms  PLAN OF CARE: Patient is seen and examined.  Patient is a 18 year old male with the above-stated past psychiatric history who was admitted after being placed under involuntary commitment by the [facility] police.  He will be admitted to the hospital.  He will be integrated into the milieu.  He will be encouraged to attend groups.  We will attempt to get in touch with his mother to contact her with regard to safety issues.  I am going to restart his previous medications to see if this will calm him down.  I will also put in place the agitation protocol if necessary.  Review of his laboratories revealed normal electrolytes, a mildly low white blood cell count at 3.6.  Normal differential.  Otherwise drug screen was only positive for marijuana.  I certify that inpatient services furnished can reasonably be expected to improve the patient's condition.   Antonieta Pert, MD 04/09/2019, 10:20 AM

## 2019-04-09 NOTE — H&P (Signed)
Psychiatric Admission Assessment Adult  Patient Identification: Patrick Patrick [name] Needs MRN:  [id number] Date of Evaluation:  04/09/2019 Chief Complaint:  mdd recurrent severe  IED Principal Diagnosis: <principal problem not specified> Diagnosis:  Active Problems:   MDD (major depressive disorder)  History of Present Illness: Patient is seen and examined.  Patient is Patrick [name] 18 year old male with Patrick [name] reported past psychiatric history significant for intermittent explosive disorder and Patrick [name] previous diagnosis of oppositional defiant disorder who [facility] emergency department on 04/08/2019 under involuntary commitment.  [place] [facility] [place] [facility] [place].  The patient stated he has been living in [name] part of town that is bad, and he needed to get away from there.  He moved back into the home with his mother approximately Patrick [name] week ago.  He and his mother do not get along and they have been arguing.  He told his mother that he was going to go jump off [name] bridge to "make her feel bad".  He stated he had no intention of killing himself.  He stated that he would [facility] the county jail to be here.  He stated that by keeping him here it just makes him angry.  He admitted to having problems with anger in the past.  The patient was hospitalized in June 2018 and diagnosed with disruptive mood dysregulation disorder.  He was having anger episodes at that time.  He stated that whenever anyone gets in his way he gets very angry.  His previous psychiatric hospitalizations included medications including Cogentin, olanzapine and trazodone.  He had been scheduled to follow-up with Dr. Jannifer FranklinAkintayo in the past.  He stated that he had not taken medications after he been discharged.  He stated the only things he had problems with were drugs and issues with legal problems regarding guns.  He stated today that he was on probation.  His drug screen was positive for marijuana, but no other drugs  or alcohol.  He denied suicidal ideation today.  He is upset over the fact that I need collateral information before I can release him.  He was admitted to the hospital for evaluation and stabilization.  Associated Signs/Symptoms: Depression Symptoms:  insomnia, psychomotor agitation, anxiety, disturbed sleep, (Hypo) Manic Symptoms:  Impulsivity, Irritable Mood, Labiality of Mood, Anxiety Symptoms:  Excessive Worry, Psychotic Symptoms:  Paranoia, PTSD Symptoms: Negative Total Time spent with patient: 45 minutes  Past Psychiatric History: Patient has Patrick [name] history of previous psychiatric admissions.  At least in our facility his last psychiatric admission was on 10/09/2015.  He was diagnosed with disruptive mood dysregulation disorder as well as oppositional defiant disorder.  Somewhere in the past the intermittent explosive disorder diagnosis has been given to him, but unclear where.  It is probably the most accurate of his history.  Is the patient at risk to self? No.  Has the patient been Patrick [name] risk to self in the past 6 months? No.  Has the patient been Patrick [name] risk to self within the distant past? Yes.    Is the patient Patrick [name] risk to others? Yes.    Has the patient been Patrick [name] risk to others in the past 6 months? Yes.    Has the patient been Patrick [name] risk to others within the distant past? No.   Prior Inpatient Therapy:   Prior Outpatient Therapy:    Alcohol Screening: 1. How often do you have Patrick [name] drink containing alcohol?: 2 to 4 times Patrick [name] month 2. How many drinks  containing alcohol do you have on Patrick [name] typical day when you are drinking?: 3 or 4 3. How often do you have six or more drinks on one occasion?: Never AUDIT-C Score: 3 4. How often during the last year have you found that you were not able to stop drinking once you had started?: Never 5. How often during the last year have you failed to do what was normally expected from you becasue of drinking?: Never 6. How often during the last year have you needed Patrick [name]  first drink in the morning to get yourself going after Patrick [name] heavy drinking session?: Never 7. How often during the last year have you had Patrick [name] feeling of guilt of remorse after drinking?: Never 8. How often during the last year have you been unable to remember what happened the night before because you had been drinking?: Never 9. Have you or someone else been injured as Patrick [name] result of your drinking?: No 10. Has Patrick [name] relative or friend or Patrick [name] doctor or another health worker been concerned about your drinking or suggested you cut down?: No Alcohol Use Disorder Identification Test Final Score (AUDIT): 3 Alcohol Brief Interventions/Follow-up: AUDIT Score <7 follow-up not indicated Substance Abuse History in the last 12 months:  Yes.   Consequences of Substance Abuse: Negative Previous Psychotropic Medications: Yes  Psychological Evaluations: Yes  Past Medical History:  Past Medical History:  Diagnosis Date  . Anxiety   . Depressive disorder 10/10/2015    Past Surgical History:  Procedure Laterality Date  . HERNIA REPAIR     Family History:  Family History  Problem Relation Age of Onset  . Heart failure Maternal Grandmother   . Atrial fibrillation Maternal Grandmother   . Heart attack Other        maternal uncle had MI at age 3   Family Psychiatric  History: Negative. Tobacco Screening: Have you used any form of tobacco in the last 30 days? (Cigarettes, Smokeless Tobacco, Cigars, and/or Pipes): Yes Tobacco use, Select all that apply: cigar use daily(4-5 black milds day) Are you interested in Tobacco Cessation Medications?: No, patient refused Counseled patient on smoking cessation including recognizing danger situations, developing coping skills and basic information about quitting provided: Refused/Declined practical counseling Social History:  Social History   Substance and Sexual Activity  Alcohol Use Yes     Social History   Substance and Sexual Activity  Drug Use Yes  . Types:  Marijuana    Additional Social History:                           Allergies:   Allergies  Allergen Reactions  . Bee Venom Anaphylaxis    unknown   Lab Results:  Results for orders placed or performed during the hospital encounter of 04/08/19 (from the past 48 hour(s))  Urine rapid drug screen (hosp performed)     Status: Abnormal   Collection Time: 04/08/19 11:54 AM  Result Value Ref Range   Opiates NONE DETECTED NONE DETECTED   Cocaine NONE DETECTED NONE DETECTED   Benzodiazepines NONE DETECTED NONE DETECTED   Amphetamines NONE DETECTED NONE DETECTED   Tetrahydrocannabinol POSITIVE (Patrick [name]) NONE DETECTED   Barbiturates NONE DETECTED NONE DETECTED    Comment: (NOTE) DRUG SCREEN FOR MEDICAL PURPOSES ONLY.  IF CONFIRMATION IS NEEDED FOR ANY PURPOSE, NOTIFY LAB WITHIN 5 DAYS. LOWEST DETECTABLE LIMITS FOR URINE DRUG SCREEN Drug Class  Cutoff (ng/mL) Amphetamine and metabolites    1000 Barbiturate and metabolites    200 Benzodiazepine                 409 Tricyclics and metabolites     300 Opiates and metabolites        300 Cocaine and metabolites        300 THC                            50 Performed at [facility], [place] [address] [place], [place] [address]   Comprehensive metabolic panel     Status: None   Collection Time: 04/08/19 12:15 PM  Result Value Ref Range   Sodium 138 135 - 145 mmol/L   Potassium 4.3 3.5 - 5.1 mmol/L   Chloride 104 98 - 111 mmol/L   CO2 26 22 - 32 mmol/L   Glucose, Bld 92 70 - 99 mg/dL   BUN 12 6 - 20 mg/dL   Creatinine, Ser 1.09 0.61 - 1.24 mg/dL   Calcium 9.9 8.9 - 10.3 mg/dL   Total Protein 7.3 6.5 - 8.1 g/dL   Albumin 4.3 3.5 - 5.0 g/dL   AST 18 15 - 41 U/L   ALT 15 0 - 44 U/L   Alkaline Phosphatase 67 38 - 126 U/L   Total Bilirubin 0.9 0.3 - 1.2 mg/dL   GFR calc non Af Amer >60 >60 mL/min   GFR calc Af Amer >60 >60 mL/min   Anion gap 8 5 - 15    Comment: Performed at [facility] [address] [place], [place] [address]  Ethanol     Status: None   Collection Time: 04/08/19 12:15 PM  Result Value Ref Range   Alcohol, Ethyl (B) <10 <10 mg/dL    Comment: (NOTE) Lowest detectable limit for serum alcohol is 10 mg/dL. For medical purposes only. Performed at [facility], [place] [address] [place],  [address]   CBC with Diff     Status: Abnormal   Collection Time: 04/08/19 12:15 PM  Result Value Ref Range   WBC 3.6 (L) 4.0 - 10.5 K/uL   RBC 5.59 4.22 - 5.81 MIL/uL   Hemoglobin 15.0 13.0 - 17.0 g/dL   HCT 46.1 39.0 - 52.0 %   MCV 82.5 80.0 - 100.0 fL   MCH 26.8 26.0 - 34.0 pg   MCHC 32.5 30.0 - 36.0 g/dL   RDW 13.8 11.5 - 15.5 %   Platelets 250 150 - 400 K/uL   nRBC 0.0 0.0 - 0.2 %   Neutrophils Relative % 53 %   Neutro Abs 1.9 1.7 - 7.7 K/uL   Lymphocytes Relative 29 %   Lymphs Abs 1.0 0.7 - 4.0 K/uL   Monocytes Relative 11 %   Monocytes Absolute 0.4 0.1 - 1.0 K/uL   Eosinophils Relative 6 %   Eosinophils Absolute 0.2 0.0 - 0.5 K/uL   Basophils Relative 1 %   Basophils Absolute 0.1 0.0 - 0.1 K/uL   Immature Granulocytes 0 %   Abs Immature Granulocytes 0.01 0.00 - 0.07 K/uL    Comment: Performed at [facility] [address], [place], [place] [address]  Acetaminophen level     Status: Abnormal   Collection Time: 04/08/19 12:15 PM  Result Value Ref Range   Acetaminophen (Tylenol), Serum <10 (L) 10 - 30 ug/mL    Comment: (NOTE)  Therapeutic concentrations vary significantly. Patrick [name] range of 10-30 ug/mL  may be an effective concentration for many patients. However, some  are best treated at concentrations outside of this range. Acetaminophen concentrations >150 ug/mL at 4 hours after ingestion  and >50 ug/mL at 12 hours after ingestion are often associated with  toxic reactions. Performed at [facility], [address], [place], [place] [address]   Salicylate level     Status: None   Collection Time: 04/08/19 12:15 PM  Result Value Ref  Range   Salicylate Lvl <7.0 2.8 - 30.0 mg/dL    Comment: Performed at [facility], [address] [place], [place] [address]  SARS CORONAVIRUS 2 (TAT 6-24 HRS) Nasopharyngeal Nasopharyngeal Swab     Status: None   Collection Time: 04/08/19  1:33 PM   Specimen: Nasopharyngeal Swab  Result Value Ref Range   SARS Coronavirus 2 NEGATIVE NEGATIVE    Comment: (NOTE) SARS-CoV-2 target nucleic acids are NOT DETECTED. The SARS-CoV-2 RNA is generally detectable in upper and lower respiratory specimens during the acute phase of infection. Negative results do not preclude SARS-CoV-2 infection, do not rule out co-infections with other pathogens, and should not be used as the sole basis for treatment or other patient management decisions. Negative results must be combined with clinical observations, patient history, and epidemiological information. The expected result is Negative. Fact Sheet for Patients: HairSlick.no Fact Sheet for Healthcare Providers: quierodirigir.com This test is not yet approved or cleared by the [place] FDA and  has been authorized for detection and/or diagnosis of SARS-CoV-2 by FDA under an Emergency Use Authorization (EUA). This EUA will remain  in effect (meaning this test can be used) for the duration of the COVID-19 declaration under Section 56 4(b)(1) of the Act, 21 U.S.C. section 360bbb-3(b)(1), unless the authorization is terminated or revoked sooner. Performed at [facility], [address], [place], [place] [address]     Blood Alcohol level:  Lab Results  Component Value Date   ETH <10 04/08/2019   ETH 11 (H) 07/27/2018    Metabolic Disorder Labs:  No results found for: HGBA1C, MPG No results found for: PROLACTIN No results found for: CHOL, TRIG, HDL, CHOLHDL, VLDL, LDLCALC  Current Medications: Current Facility-Administered Medications  Medication Dose Route Frequency  Provider Last Rate Last Dose  . acetaminophen (TYLENOL) tablet 650 mg  650 mg Oral Q6H PRN Anike, Adaku C, NP      . alum & mag hydroxide-simeth (MAALOX/MYLANTA) 200-200-20 MG/5ML suspension 30 mL  30 mL Oral Q4H PRN Anike, Adaku C, NP      . hydrOXYzine (ATARAX/VISTARIL) tablet 25 mg  25 mg Oral TID PRN Anike, Adaku C, NP   25 mg at 04/09/19 0208  . OLANZapine zydis (ZYPREXA) disintegrating tablet 10 mg  10 mg Oral Q8H PRN Antonieta Pert, MD       And  . LORazepam (ATIVAN) tablet 1 mg  1 mg Oral PRN Antonieta Pert, MD       And  . ziprasidone (GEODON) injection 20 mg  20 mg Intramuscular PRN Antonieta Pert, MD      . magnesium hydroxide (MILK OF MAGNESIA) suspension 30 mL  30 mL Oral Daily PRN Anike, Adaku C, NP      . OLANZapine zydis (ZYPREXA) disintegrating tablet 10 mg  10 mg Oral QHS Antonieta Pert, MD      . traZODone (DESYREL) tablet 50 mg  50 mg Oral QHS  PRN Anike, Adaku C, NP   50 mg at 04/09/19 0208   PTA Medications: No medications prior to admission.    Musculoskeletal: Strength & Muscle Tone: within normal limits Gait & Station: normal Patient leans: N/Patrick [name]  Psychiatric Specialty Exam: Physical Exam  Nursing note and vitals reviewed. Constitutional: He is oriented to person, place, and time. He appears well-developed and well-nourished.  HENT:  Head: Normocephalic and atraumatic.  Respiratory: Effort normal.  Neurological: He is alert and oriented to person, place, and time.    ROS  Height  (1.778 m), weight 61.7 kg.Body mass index is 19.51 kg/m.  General Appearance: Disheveled  Eye Contact:  Good  Speech:  Pressured  Volume:  Increased  Mood:  Irritable  Affect:  Congruent  Thought Process:  Coherent and Descriptions of Associations: Circumstantial  Orientation:  Full (Time, Place, and Person)  Thought Content:  Rumination  Suicidal Thoughts:  No  Homicidal Thoughts:  No  Memory:  Immediate;   Fair Recent;   Fair Remote;   Fair   Judgement:  Impaired  Insight:  Lacking  Psychomotor Activity:  Increased  Concentration:  Concentration: Poor and Attention Span: Poor  Recall:  [employer] of Knowledge:  Fair  Language:  Fair  Akathisia:  Negative  Handed:  Right  AIMS (if indicated):     Assets:  Desire for Improvement Resilience  ADL's:  Intact  Cognition:  WNL  Sleep:       Treatment Plan Summary: Daily contact with patient to assess and evaluate symptoms and progress in treatment, Medication management and Plan : Patient is seen and examined.  Patient is Patrick [name] 18 year old male with the above-stated past psychiatric history who was admitted after being placed under involuntary commitment by the [facility].  He will be admitted to the hospital.  He will be integrated into the milieu.  He will be encouraged to attend groups.  We will attempt to get in touch with his mother to contact her with regard to safety issues.  I am going to restart his previous medications to see if this will calm him down.  I will also put in place the agitation protocol if necessary.  Review of his laboratories revealed normal electrolytes, Patrick [name] mildly low white blood cell count at 3.6.  Normal differential.  Otherwise drug screen was only positive for marijuana.  Observation Level/Precautions:  15 minute checks  Laboratory:  Chemistry Profile  Psychotherapy:    Medications:    Consultations:    Discharge Concerns:    Estimated LOS:  Other:     Physician Treatment Plan for Primary Diagnosis: <principal problem not specified> Long Term Goal(s): Improvement in symptoms so as ready for discharge  Short Term Goals: Ability to identify changes in lifestyle to reduce recurrence of condition will improve, Ability to verbalize feelings will improve, Ability to disclose and discuss suicidal ideas, Ability to demonstrate self-control will improve, Ability to identify and develop effective coping behaviors will improve, Ability to maintain  clinical measurements within normal limits will improve and Ability to identify triggers associated with substance abuse/mental health issues will improve  Physician Treatment Plan for Secondary Diagnosis: Active Problems:   MDD (major depressive disorder)  Long Term Goal(s): Improvement in symptoms so as ready for discharge  Short Term Goals: Ability to identify changes in lifestyle to reduce recurrence of condition will improve, Ability to verbalize feelings will improve, Ability to disclose and discuss suicidal ideas, Ability to demonstrate self-control will improve, Ability to  identify and develop effective coping behaviors will improve, Ability to maintain clinical measurements within normal limits will improve, Compliance with prescribed medications will improve and Ability to identify triggers associated with substance abuse/mental health issues will improve  I certify that inpatient services furnished can reasonably be expected to improve the patient's condition.    Antonieta Pert, MD 11/6/20203:03 PM

## 2019-04-09 NOTE — Tx Team (Signed)
Interdisciplinary Treatment and Diagnostic Plan Update  04/09/2019 Time of Session: 9:00am Patrick [name] MRN: [id number]  Principal Diagnosis: <principal problem not specified>  Secondary Diagnoses: Active Problems:   MDD (major depressive disorder)   Current Medications:  Current Facility-Administered Medications  Medication Dose Route Frequency Provider Last Rate Last Dose  . acetaminophen (TYLENOL) tablet 650 mg  650 mg Oral Q6H PRN Anike, Adaku C, NP      . alum & mag hydroxide-simeth (MAALOX/MYLANTA) 200-200-20 MG/5ML suspension 30 mL  30 mL Oral Q4H PRN Anike, Adaku C, NP      . hydrOXYzine (ATARAX/VISTARIL) tablet 25 mg  25 mg Oral TID PRN Anike, Adaku C, NP   25 mg at 04/09/19 0208  . magnesium hydroxide (MILK OF MAGNESIA) suspension 30 mL  30 mL Oral Daily PRN Anike, Adaku C, NP      . traZODone (DESYREL) tablet 50 mg  50 mg Oral QHS PRN Anike, Adaku C, NP   50 mg at 04/09/19 0208   PTA Medications: Medications Prior to Admission  Medication Sig Dispense Refill Last Dose  . cephALEXin (KEFLEX) 500 MG capsule Take 1 capsule (500 mg total) by mouth 4 (four) times daily. 20 capsule 0   . ondansetron (ZOFRAN ODT) 4 MG disintegrating tablet Take 1 tablet (4 mg total) by mouth every 8 (eight) hours as needed for nausea or vomiting. 20 tablet 0     Patient Stressors:    Patient Strengths:    Treatment Modalities: Medication Management, Group therapy, Case management,  1 to 1 session with clinician, Psychoeducation, Recreational therapy.   Physician Treatment Plan for Primary Diagnosis: <principal problem not specified> Long Term Goal(s):     Short Term Goals:    Medication Management: Evaluate patient's response, side effects, and tolerance of medication regimen.  Therapeutic Interventions: 1 to 1 sessions, Unit Group sessions and Medication administration.  Evaluation of Outcomes: Not Met  Physician Treatment Plan for Secondary Diagnosis: Active Problems:   MDD  (major depressive disorder)  Long Term Goal(s):     Short Term Goals:       Medication Management: Evaluate patient's response, side effects, and tolerance of medication regimen.  Therapeutic Interventions: 1 to 1 sessions, Unit Group sessions and Medication administration.  Evaluation of Outcomes: Not Met   RN Treatment Plan for Primary Diagnosis: <principal problem not specified> Long Term Goal(s): Knowledge of disease and therapeutic regimen to maintain health will improve  Short Term Goals: Ability to verbalize frustration and anger appropriately will improve, Ability to verbalize feelings will improve, Ability to disclose and discuss suicidal ideas and Ability to identify and develop effective coping behaviors will improve  Medication Management: RN will administer medications as ordered by provider, will assess and evaluate patient's response and provide education to patient for prescribed medication. RN will report any adverse and/or side effects to prescribing provider.  Therapeutic Interventions: 1 on 1 counseling sessions, Psychoeducation, Medication administration, Evaluate responses to treatment, Monitor vital signs and CBGs as ordered, Perform/monitor CIWA, COWS, AIMS and Fall Risk screenings as ordered, Perform wound care treatments as ordered.  Evaluation of Outcomes: Not Met   LCSW Treatment Plan for Primary Diagnosis: <principal problem not specified> Long Term Goal(s): Safe transition to appropriate next level of care at discharge, Engage patient in therapeutic group addressing interpersonal concerns.  Short Term Goals: Engage patient in aftercare planning with referrals and resources, Increase social support, Increase emotional regulation, Identify triggers associated with mental health/substance abuse issues and Increase skills for wellness  and recovery  Therapeutic Interventions: Assess for all discharge needs, 1 to 1 time with Education officer, museum, Explore available  resources and support systems, Assess for adequacy in community support network, Educate family and significant other(s) on suicide prevention, Complete Psychosocial Assessment, Interpersonal group therapy.  Evaluation of Outcomes: Not Met   Progress in Treatment: Attending groups: No. New to unit. Participating in groups: No. Taking medication as prescribed: Yes. Toleration medication: Yes. Family/Significant other contact made: No, will contact:  supports if consents are granted. Patient understands diagnosis: Yes. Discussing patient identified problems/goals with staff: Yes. Medical problems stabilized or resolved: Yes. Denies suicidal/homicidal ideation: No. Issues/concerns per patient self-inventory: Yes.  New problem(s) identified: Yes, Describe:  legal issues  New Short Term/Long Term Goal(s): medication management for mood stabilization; elimination of SI thoughts; development of comprehensive mental wellness/sobriety plan.  Patient Goals:    Discharge Plan or Barriers: Patient is new to unit. CSW assessing for appropriate referrals.  Reason for Continuation of Hospitalization: Aggression Anxiety Depression Medication stabilization Suicidal ideation  Estimated Length of Stay: 3-5 days  Attendees: Patient:  04/09/2019 9:04 AM  Physician: Queen Blossom 04/09/2019 9:04 AM  Nursing: Benjamine Mola, RN 04/09/2019 9:04 AM  RN Care Manager: 04/09/2019 9:04 AM  Social Worker: Stephanie Acre, [place] 04/09/2019 9:04 AM  Recreational Therapist:  04/09/2019 9:04 AM  Other:  04/09/2019 9:04 AM  Other:  04/09/2019 9:04 AM  Other: 04/09/2019 9:04 AM    Scribe for Treatment Team: Joellen Jersey, [place] 04/09/2019 9:04 AM

## 2019-04-09 NOTE — Progress Notes (Signed)
Admission Note:  D: 18 yr male who presents IVC in no acute distress for the treatment of SI and agitation. Pt appears agitated , pt stated he wanted to go, but could not understand that he was being admitted to the hospital for telling his mother he wanted to jump off a bridge and telling her he did not care if he lived or died. Pt was brought to the unit by [employer], pt was de-escalated in the quiet room and pt agreed to finish the admission in his room. Pt stated he was on the phone with his mother and wanted to make her feel bad like she made him feel bad. Pt appears to have a lot of unresolved anger towards someone or something that has happened to him. Pt was encouraged to talk to someone. Pt expressed his desire to leave. Pt stated he would cooperate while here , but pt does not appear to understand taking responsibility for his actions.   A: Skin was assessed and found to be clear of any abnormal marks  PT searched and no contraband found, POC and unit policies explained and understanding verbalized. Consents obtained. Food and fluids offered, and  accepted.    R:Pt had no additional questions or concerns.

## 2019-04-09 NOTE — ED Notes (Signed)
Pt. Belongings given to Officer BM Sutphin [employer]. Pt to be transported to [facility] : [facility]

## 2019-04-09 NOTE — ED Notes (Signed)
Pt become agitated at this time. Stating he does not need to go to a 'mental hospital'. Pt being verabally abusive to [employer]. States he has freedom of speech. Pt not available for signature for inventory sheet

## 2019-04-09 NOTE — Progress Notes (Signed)
   04/09/19 0300  Psych Admission Type (Psych Patients Only)  Admission Status Involuntary  Psychosocial Assessment  Patient Complaints Agitation;Irritability;Worrying  Eye Contact Fair  Facial Expression Anxious  Affect Appropriate to circumstance;Preoccupied  Speech Logical/coherent  Interaction Defensive  Motor Activity Fidgety  Appearance/Hygiene Disheveled  Behavior Characteristics Agressive verbally;Cooperative;Irritable  Mood Anxious;Irritable;Pleasant  Thought Process  Coherency Circumstantial;Concrete thinking  Content Compulsions;Blaming others  Delusions WDL  Perception WDL  Hallucination None reported or observed  Judgment Poor  Confusion WDL  Danger to Self  Current suicidal ideation? Denies  Danger to Others  Danger to Others Reported or observed  Danger to Others Abnormal  Harmful Behavior to others Threats of violence towards other people observed or expressed   Destructive Behavior No threats or harm toward property  Description of Harmful Behavior pt appeared to be triggered by  a certain police officer

## 2019-04-10 DIAGNOSIS — F411 Generalized anxiety disorder: Secondary | ICD-10-CM

## 2019-04-10 NOTE — Plan of Care (Signed)
  Problem: Activity: Goal: Interest or engagement in activities will improve Outcome: Progressing Goal: Sleeping patterns will improve Outcome: Progressing   Problem: Coping: Goal: Ability to verbalize frustrations and anger appropriately will improve Outcome: Progressing Goal: Ability to demonstrate self-control will improve Outcome: Progressing    D: Pt alert and oriented on the unit. Pt engaging with RN staff and other pts. Pt denies SI/HI, A/VH, and denied any pain. Pt also participated during unit groups and activities. Pt is calm and and cooperative. Pt rated his depression, feelings of hopelessness, and anxiety all a 0, with 10 being the worst. Pt's goal for today is to "To do better and get out of here."   A: Education, support and encouragement provided, q15 minute safety checks remain in effect. Medications administered per MD orders.  R: No reactions/side effects to medicine noted. Pt denies any concerns at this time, and verbally contracts for safety. Pt ambulating on the unit with no issues. Pt remains safe on and off the unit.

## 2019-04-10 NOTE — Progress Notes (Signed)
[facility] MD Progress Note  04/10/2019 9:07 AM Patrick [name]  MRN:  [id number] Subjective: Patient is a 18 year old male with a reported past psychiatric history significant for intermittent explosive disorder and a previous diagnosis of oppositional defiant disorder who presented to the [facility] emergency department on 04/08/2019 under involuntary commitment secondary to concern for suicidal ideation.  Objective: Patient is seen and examined.  Patient is a 18 year old male with the above-stated past psychiatric history who is seen in follow-up.  He is doing much better today.  He took the low-dose Zyprexa yesterday, and really had a good afternoon.  There were no periods of time where he became irritated or agitated.  He was compliant with medications and slept well last night.  This morning he is doing very well.  Very polite, appropriate, and all the notes from the nursing notes show that he was very appropriate throughout all the groups yesterday.  I did speak to his mother yesterday, and she was quite concerned about depression.  He has agreed to a trial of low-dose antidepressant medicine.  We are starting Zoloft 25 mg p.o. daily.  He denied side effects to his current medications, and I asked him on at least 2 occasions that if he had side effects to the medicines let me know.  I told him I had no desire to give him medications that gave him side effects.  He denied any auditory or visual hallucinations.  No gross paranoia.  No suicidal or homicidal ideation.  His vital signs are stable, he is afebrile.  He slept 7.5 hours last night.  No new laboratories available.  Principal Problem: <principal problem not specified> Diagnosis: Active Problems:   MDD (major depressive disorder)  Total Time spent with patient: 20 minutes  Past Psychiatric History: See admission H&P  Past Medical History:  Past Medical History:  Diagnosis Date  . Anxiety   . Depressive disorder 10/10/2015     Past Surgical History:  Procedure Laterality Date  . HERNIA REPAIR     Family History:  Family History  Problem Relation Age of Onset  . Heart failure Maternal Grandmother   . Atrial fibrillation Maternal Grandmother   . Heart attack Other        maternal uncle had MI at age 80   Family Psychiatric  History: See admission H&P Social History:  Social History   Substance and Sexual Activity  Alcohol Use Yes     Social History   Substance and Sexual Activity  Drug Use Yes  . Types: Marijuana    Social History   Socioeconomic History  . Marital status: Single    Spouse name: Not on file  . Number of children: Not on file  . Years of education: Not on file  . Highest education level: Not on file  Occupational History  . Not on file  Social Needs  . Financial resource strain: Not on file  . Food insecurity    Worry: Not on file    Inability: Not on file  . Transportation needs    Medical: Not on file    Non-medical: Not on file  Tobacco Use  . Smoking status: Current Some Day Smoker    Types: Cigars  . Smokeless tobacco: Never Used  Substance and Sexual Activity  . Alcohol use: Yes  . Drug use: Yes    Types: Marijuana  . Sexual activity: Yes    Birth control/protection: None  Lifestyle  . Physical activity  Days per week: Not on file    Minutes per session: Not on file  . Stress: Not on file  Relationships  . Social Herbalist on phone: Not on file    Gets together: Not on file    Attends religious service: Not on file    Active member of club or organization: Not on file    Attends meetings of clubs or organizations: Not on file    Relationship status: Not on file  Other Topics Concern  . Not on file  Social History Narrative   ** Merged History Encounter **       Additional Social History:                         Sleep: Good  Appetite:  Good  Current Medications: Current Facility-Administered Medications  Medication  Dose Route Frequency Provider Last Rate Last Dose  . acetaminophen (TYLENOL) tablet 650 mg  650 mg Oral Q6H PRN Anike, Adaku C, NP      . alum & mag hydroxide-simeth (MAALOX/MYLANTA) 200-200-20 MG/5ML suspension 30 mL  30 mL Oral Q4H PRN Anike, Adaku C, NP      . hydrOXYzine (ATARAX/VISTARIL) tablet 25 mg  25 mg Oral TID PRN Anike, Adaku C, NP   25 mg at 04/09/19 0208  . OLANZapine zydis (ZYPREXA) disintegrating tablet 10 mg  10 mg Oral Q8H PRN Sharma Covert, MD       And  . LORazepam (ATIVAN) tablet 1 mg  1 mg Oral PRN Sharma Covert, MD       And  . ziprasidone (GEODON) injection 20 mg  20 mg Intramuscular PRN Sharma Covert, MD      . magnesium hydroxide (MILK OF MAGNESIA) suspension 30 mL  30 mL Oral Daily PRN Anike, Adaku C, NP      . OLANZapine zydis (ZYPREXA) disintegrating tablet 10 mg  10 mg Oral QHS Sharma Covert, MD   10 mg at 04/09/19 2057  . sertraline (ZOLOFT) tablet 25 mg  25 mg Oral Daily Sharma Covert, MD   25 mg at 04/09/19 2055  . traZODone (DESYREL) tablet 50 mg  50 mg Oral QHS PRN Anike, Adaku C, NP   50 mg at 04/09/19 2055    Lab Results:  Results for orders placed or performed during the hospital encounter of 04/08/19 (from the past 48 hour(s))  Urine rapid drug screen (hosp performed)     Status: Abnormal   Collection Time: 04/08/19 11:54 AM  Result Value Ref Range   Opiates NONE DETECTED NONE DETECTED   Cocaine NONE DETECTED NONE DETECTED   Benzodiazepines NONE DETECTED NONE DETECTED   Amphetamines NONE DETECTED NONE DETECTED   Tetrahydrocannabinol POSITIVE (A) NONE DETECTED   Barbiturates NONE DETECTED NONE DETECTED    Comment: (NOTE) DRUG SCREEN FOR MEDICAL PURPOSES ONLY.  IF CONFIRMATION IS NEEDED FOR ANY PURPOSE, NOTIFY LAB WITHIN 5 DAYS. LOWEST DETECTABLE LIMITS FOR URINE DRUG SCREEN Drug Class                     Cutoff (ng/mL) Amphetamine and metabolites    1000 Barbiturate and metabolites    200 Benzodiazepine                  355 Tricyclics and metabolites     300 Opiates and metabolites        300 Cocaine and metabolites  300 THC                            50 Performed at [facility], [address] [place], [place] [address]   Comprehensive metabolic panel     Status: None   Collection Time: 04/08/19 12:15 PM  Result Value Ref Range   Sodium 138 135 - 145 mmol/L   Potassium 4.3 3.5 - 5.1 mmol/L   Chloride 104 98 - 111 mmol/L   CO2 26 22 - 32 mmol/L   Glucose, Bld 92 70 - 99 mg/dL   BUN 12 6 - 20 mg/dL   Creatinine, Ser 1.91 0.61 - 1.24 mg/dL   Calcium 9.9 8.9 - 47.8 mg/dL   Total Protein 7.3 6.5 - 8.1 g/dL   Albumin 4.3 3.5 - 5.0 g/dL   AST 18 15 - 41 U/L   ALT 15 0 - 44 U/L   Alkaline Phosphatase 67 38 - 126 U/L   Total Bilirubin 0.9 0.3 - 1.2 mg/dL   GFR calc non Af Amer >60 >60 mL/min   GFR calc Af Amer >60 >60 mL/min   Anion gap 8 5 - 15    Comment: Performed at [facility], [address] [place], [place] [address]  Ethanol     Status: None   Collection Time: 04/08/19 12:15 PM  Result Value Ref Range   Alcohol, Ethyl (B) <10 <10 mg/dL    Comment: (NOTE) Lowest detectable limit for serum alcohol is 10 mg/dL. For medical purposes only. Performed at [facility], [address] [place], [place] [address]   CBC with Diff     Status: Abnormal   Collection Time: 04/08/19 12:15 PM  Result Value Ref Range   WBC 3.6 (L) 4.0 - 10.5 K/uL   RBC 5.59 4.22 - 5.81 MIL/uL   Hemoglobin 15.0 13.0 - 17.0 g/dL   HCT 57.8 46.9 - 62.9 %   MCV 82.5 80.0 - 100.0 fL   MCH 26.8 26.0 - 34.0 pg   MCHC 32.5 30.0 - 36.0 g/dL   RDW 52.8 41.3 - 24.4 %   Platelets 250 150 - 400 K/uL   nRBC 0.0 0.0 - 0.2 %   Neutrophils Relative % 53 %   Neutro Abs 1.9 1.7 - 7.7 K/uL   Lymphocytes Relative 29 %   Lymphs Abs 1.0 0.7 - 4.0 K/uL   Monocytes Relative 11 %   Monocytes Absolute 0.4 0.1 - 1.0 K/uL   Eosinophils Relative 6 %   Eosinophils Absolute 0.2 0.0 - 0.5 K/uL   Basophils  Relative 1 %   Basophils Absolute 0.1 0.0 - 0.1 K/uL   Immature Granulocytes 0 %   Abs Immature Granulocytes 0.01 0.00 - 0.07 K/uL    Comment: Performed at [facility], [address] [place], [place] [address]  Acetaminophen level     Status: Abnormal   Collection Time: 04/08/19 12:15 PM  Result Value Ref Range   Acetaminophen (Tylenol), Serum <10 (L) 10 - 30 ug/mL    Comment: (NOTE) Therapeutic concentrations vary significantly. A range of 10-30 ug/mL  may be an effective concentration for many patients. However, some  are best treated at concentrations outside of this range. Acetaminophen concentrations >150 ug/mL at 4 hours after ingestion  and >50 ug/mL at 12 hours after ingestion are often associated with  toxic reactions. Performed at [facility], [address] [place], [place] [address]   Salicylate level     Status: None   Collection Time: 04/08/19 12:15 PM  Result Value Ref Range   Salicylate Lvl <7.0 2.8 - 30.0 mg/dL    Comment: Performed at [facility], [address] [place], [place] [address]  SARS CORONAVIRUS 2 (TAT 6-24 HRS) Nasopharyngeal Nasopharyngeal Swab     Status: None   Collection Time: 04/08/19  1:33 PM   Specimen: Nasopharyngeal Swab  Result Value Ref Range   SARS Coronavirus 2 NEGATIVE NEGATIVE    Comment: (NOTE) SARS-CoV-2 target nucleic acids are NOT DETECTED. The SARS-CoV-2 RNA is generally detectable in upper and lower respiratory specimens during the acute phase of infection. Negative results do not preclude SARS-CoV-2 infection, do not rule out co-infections with other pathogens, and should not be used as the sole basis for treatment or other patient management decisions. Negative results must be combined with clinical observations, patient history, and epidemiological information. The expected result is Negative. Fact Sheet for Patients: HairSlick.no Fact Sheet for Healthcare  Providers: quierodirigir.com This test is not yet approved or cleared by the [place] FDA and  has been authorized for detection and/or diagnosis of SARS-CoV-2 by FDA under an Emergency Use Authorization (EUA). This EUA will remain  in effect (meaning this test can be used) for the duration of the COVID-19 declaration under Section 56 4(b)(1) of the Act, 21 U.S.C. section 360bbb-3(b)(1), unless the authorization is terminated or revoked sooner. Performed at [facility], [address] [place], [place] [address]     Blood Alcohol level:  Lab Results  Component Value Date   ETH <10 04/08/2019   ETH 11 (H) 07/27/2018    Metabolic Disorder Labs: No results found for: HGBA1C, MPG No results found for: PROLACTIN No results found for: CHOL, TRIG, HDL, CHOLHDL, VLDL, LDLCALC  Physical Findings: AIMS:  , ,  ,  ,    CIWA:    COWS:     Musculoskeletal: Strength & Muscle Tone: within normal limits Gait & Station: normal Patient leans: N/A  Psychiatric Specialty Exam: Physical Exam  Nursing note and vitals reviewed. Constitutional: He is oriented to person, place, and time. He appears well-developed and well-nourished.  HENT:  Head: Normocephalic and atraumatic.  Respiratory: Effort normal.  Neurological: He is alert and oriented to person, place, and time.    ROS  Blood pressure 105/61, pulse (!) 106, temperature 98.9 F (37.2 C), temperature source Oral, resp. rate 16, height  (1.778 m), weight 61.7 kg, SpO2 99 %.Body mass index is 19.51 kg/m.  General Appearance: Casual  Eye Contact:  Good  Speech:  Normal Rate  Volume:  Normal  Mood:  Euthymic  Affect:  Congruent  Thought Process:  Coherent and Descriptions of Associations: Intact  Orientation:  Full (Time, Place, and Person)  Thought Content:  Logical  Suicidal Thoughts:  No  Homicidal Thoughts:  No  Memory:  Immediate;   Good Recent;   Good Remote;   Good  Judgement:   Intact  Insight:  Fair  Psychomotor Activity:  Normal  Concentration:  Concentration: Fair and Attention Span: Fair  Recall:  [employer] of Knowledge:  Fair  Language:  Good  Akathisia:  Negative  Handed:  Right  AIMS (if indicated):     Assets:  Desire for Improvement Housing Physical Health Resilience Social Support Talents/Skills  ADL's:  Intact  Cognition:  WNL  Sleep:  Number of Hours: 7.5  Treatment Plan Summary: Daily contact with patient to assess and evaluate symptoms and progress in treatment, Medication management and Plan : Patient is seen and examined.  Patient is a 18 year old male with the above-stated past psychiatric history who is seen in follow-up.   Diagnosis: #1 intermittent explosive disorder, #2 major depression, recurrent, severe without psychotic features, #3 cannabis use disorder.  Patient is seen in follow-up.  He is doing much better today.  We will monitor how things go today, and if he remains appropriate we may consider discharge in 1 to 2 days.  He is very concerned he is going to lose his job, and I do not want that to happen.  I talked with his mother on the phone for quite a while yesterday, and discussed the discharge plan.  No change in his current medications. 1.  Continue hydroxyzine 25 mg p.o. 3 times daily as needed anxiety. 2.  Continue Zyprexa 10 mg p.o. nightly for mood stability, anger management and sleep. 3.  Start Zoloft 25 mg p.o. daily for mood, anxiety and anger. 4.  Continue trazodone 50 mg p.o. nightly as needed insomnia. 5.  Disposition planning-in progress.  Antonieta PertGreg Lawson Lonzo Saulter, MD 04/10/2019, 9:07 AM

## 2019-04-10 NOTE — [facility] Group Notes (Signed)
  [facility] LCSW Group Therapy Note  Date/Time:  04/10/2019 11:15AM-12:00PM  Type of Therapy and Topic:  Group Therapy:  Feelings About Hospitalization  Participation Level:  Active   Description of Group This process group involved patients discussing their feelings related to being hospitalized, as well as the benefits they see to being in the hospital.  These feelings and benefits were itemized.  The group then brainstormed specific ways in which they could seek those same benefits when they discharge and return home.  Therapeutic Goals 1. Patient will identify and describe positive and negative feelings related to hospitalization 2. Patient will verbalize benefits of hospitalization to themselves personally 3. Patients will brainstorm together ways they can obtain similar benefits in the outpatient setting, identify barriers to wellness and possible solutions  Summary of Patient Progress:  The patient expressed his primary feelings about being hospitalized are fairly negative, saying it is "like being locked up, only worse than jail."  He said the only things that are better than jail are having a phone to use, being able to leave his room, and the food.  He stated the way he will stay out of the hospital in the future is to "keep my mouth shut" and smoke marijuana.  He was not engaged in group and was very happy when it ended.  Therapeutic Modalities Cognitive Behavioral Therapy Motivational Interviewing    Selmer Dominion, LCSW 04/10/2019, 1:48 PM

## 2019-04-10 NOTE — Progress Notes (Signed)
Patient ID: Patrick [name], male   DOB: 05/05/2001, 18 y.o.   MRN: [id number]  [place] NOVEL CORONAVIRUS (COVID-19) DAILY CHECK-OFF SYMPTOMS - answer yes or no to each - every day NO YES  Have you had a fever in the past 24 hours?  . Fever (Temp > 37.80C / 100F) X   Have you had any of these symptoms in the past 24 hours? . New Cough .  Sore Throat  .  Shortness of Breath .  Difficulty Breathing .  Unexplained Body Aches   X   Have you had any one of these symptoms in the past 24 hours not related to allergies?   . Runny Nose .  Nasal Congestion .  Sneezing   X   If you have had runny nose, nasal congestion, sneezing in the past 24 hours, has it worsened?  X   EXPOSURES - check yes or no X   Have you traveled outside the state in the past 14 days?  X   Have you been in contact with someone with a confirmed diagnosis of COVID-19 or PUI in the past 14 days without wearing appropriate PPE?  X   Have you been living in the same home as a person with confirmed diagnosis of COVID-19 or a PUI (household contact)?    X   Have you been diagnosed with COVID-19?    X              What to do next: Answered NO to all: Answered YES to anything:   Proceed with unit schedule Follow the [facility] Inpatient Flowsheet.

## 2019-04-10 NOTE — Tx Team (Signed)
Initial Treatment Plan 04/10/2019 12:39 AM Patrick [name] QPR:[id number]    PATIENT STRESSORS: Financial difficulties Marital or family conflict Medication change or noncompliance   PATIENT STRENGTHS: General fund of knowledge Motivation for treatment/growth   PATIENT IDENTIFIED PROBLEMS:  risk for suicide  "go home"                   DISCHARGE CRITERIA:  Improved stabilization in mood, thinking, and/or behavior Verbal commitment to aftercare and medication compliance  PRELIMINARY DISCHARGE PLAN: Attend PHP/IOP Outpatient therapy  PATIENT/FAMILY INVOLVEMENT: This treatment plan has been presented to and reviewed with the patient, Patrick [name].  The patient and family have been given the opportunity to ask questions and make suggestions.  Providence Crosby, RN 04/10/2019, 12:39 AM

## 2019-04-10 NOTE — Progress Notes (Signed)
Patient has been up and active on the unit, attended group this evening and has voiced no complaints. Patient currently denies having pain, -si/hi/a/v hall. Support and encouragement offered, safety maintained on unit, will continue to monitor.  

## 2019-04-10 NOTE — Progress Notes (Signed)
[facility] Group Notes:  (Nursing/MHT/Case Management/Adjunct)  Date:  04/10/2019  Time:  0900  Type of Therapy:  Nurse Education  Participation Level:  Active  Participation Quality:  Appropriate  Affect:  Appropriate  Cognitive:  Appropriate  Insight:  Appropriate  Engagement in Group:  Engaged  Modes of Intervention:  Activity and Discussion  Summary of Progress/Problems: Coloring  activity on challenges the patient is facing and positive coping mechanisms for overcoming challenges.   Deaire Mcwhirter L 04/10/2019, 9:53 AM 

## 2019-04-11 DIAGNOSIS — F1220 Cannabis dependence, uncomplicated: Secondary | ICD-10-CM

## 2019-04-11 MED ORDER — TRAZODONE HCL 50 MG PO TABS: 50.0000 mg | ORAL_TABLET | Freq: Every evening | ORAL | 0 refills | Status: DC | PRN

## 2019-04-11 MED ORDER — OLANZAPINE 10 MG PO TBDP: 10.0000 mg | ORAL_TABLET | Freq: Every day | ORAL | 0 refills | Status: DC

## 2019-04-11 MED ORDER — HYDROXYZINE HCL 25 MG PO TABS: 25.0000 mg | ORAL_TABLET | Freq: Three times a day (TID) | ORAL | 0 refills | Status: DC | PRN

## 2019-04-11 MED ORDER — SERTRALINE HCL 25 MG PO TABS: 25.0000 mg | ORAL_TABLET | Freq: Every day | ORAL | 0 refills | Status: DC

## 2019-04-11 NOTE — [facility] Suicide Risk Assessment (Signed)
[facility] INPATIENT:  Family/Significant Other Suicide Prevention Education  Suicide Prevention Education:  Education Completed; mother Earlean Polka [phone] ,  (name of family member/significant other) has been identified by the patient as the family member/significant other with whom the patient will be residing, and identified as the person(s) who will aid the patient in the event of a mental health crisis (suicidal ideations/suicide attempt).  With written consent from the patient, the family member/significant other has been provided the following suicide prevention education, prior to the and/or following the discharge of the patient.  Mother has no concerns about discharge, only wanted to know how to support him and notice if he is deteriorating. The SPE was helpful to her.  Also sending home a brochure with the words on it "Please share with mother."  The suicide prevention education provided includes the following:  Suicide risk factors  Suicide prevention and interventions  National Suicide Hotline telephone number  [facility] assessment telephone number  [facility] Emergency Assistance [place] and/or Residential Mobile Crisis Unit telephone number  Request made of family/significant other to:  Remove weapons (e.g., guns, rifles, knives), all items previously/currently identified as safety concern.    Remove drugs/medications (over-the-counter, prescriptions, illicit drugs), all items previously/currently identified as a safety concern.  The family member/significant other verbalizes understanding of the suicide prevention education information provided.  The family member/significant other agrees to remove the items of safety concern listed above.  Berlin Hun Grossman-Orr 04/11/2019, 9:38 AM

## 2019-04-11 NOTE — Discharge Summary (Signed)
Physician Discharge Summary Note  Patient:  Patrick [name] is an 18 y.o., male MRN:  [id number] DOB:  August 16, 2000 Patient phone:  [phone] (home)  Patient [place]   [address] [place] [id number],  Total Time spent with patient: 15 minutes  Date of Admission:  04/09/2019 Date of Discharge: 04/11/2019  Reason for Admission: Per admission assessment note: Patrick [name] is an 18 y.o. male [facility] via [facility]. [employer] initiated IVC. Patient renders conflicting history from collateral. Patient states that he just moved back in with his mother 1 week ago and they have been arguing a lot. He states that today he "wanted to make her feel bad" so he called her and told her was going to jump off a bridge. Patient states that he did not mean it. Patient denies SI/HI/AVH. He denies any depressive symptoms. Patient admits to hospitalization as a teenager for his "anger." Patient reports no drug use due to being on probation. He reports he last THC use was 2 months ago, however his UDS is positive. Patient denies any trauma history or family history of mental illness. Patient states that he has several current criminal charges "Mostly for drugs but some for guns" and is currently on probation. Patient gave verbal consent for TTS to contact his mother, Patrick [name] at [phone].  Principal Problem: MDD (major depressive disorder) Discharge Diagnoses: Principal Problem:   MDD (major depressive disorder)   Past Psychiatric History:   Past Medical History:  Past Medical History:  Diagnosis Date  . Anxiety   . Depressive disorder 10/10/2015    Past Surgical History:  Procedure Laterality Date  . HERNIA REPAIR     Family History:  Family History  Problem Relation Age of Onset  . Heart failure Maternal Grandmother   . Atrial fibrillation Maternal Grandmother   . Heart attack Other        maternal uncle had MI at age 20   Family Psychiatric  History:  Social History:  Social  History   Substance and Sexual Activity  Alcohol Use Yes     Social History   Substance and Sexual Activity  Drug Use Yes  . Types: Marijuana    Social History   Socioeconomic History  . Marital status: Single    Spouse name: Not on file  . Number of children: Not on file  . Years of education: Not on file  . Highest education level: Not on file  Occupational History  . Not on file  Social Needs  . Financial resource strain: Not on file  . Food insecurity    Worry: Not on file    Inability: Not on file  . Transportation needs    Medical: Not on file    Non-medical: Not on file  Tobacco Use  . Smoking status: Current Some Day Smoker    Types: Cigars  . Smokeless tobacco: Never Used  Substance and Sexual Activity  . Alcohol use: Yes  . Drug use: Yes    Types: Marijuana  . Sexual activity: Yes    Birth control/protection: None  Lifestyle  . Physical activity    Days per week: Not on file    Minutes per session: Not on file  . Stress: Not on file  Relationships  . Social Musicianconnections    Talks on phone: Not on file    Gets together: Not on file    Attends religious service: Not on file    Active member of  club or organization: Not on file    Attends meetings of clubs or organizations: Not on file    Relationship status: Not on file  Other Topics Concern  . Not on file  Social History Narrative   ** Merged History Encounter **        Hospital Course: Patrick [name] was admitted for MDD (major depressive disorder) , without psychosis and crisis management.  Pt was treated discharged with the medications listed below under Medication List.  Medical problems were identified and treated as needed.  Home medications were restarted as appropriate.  Improvement was monitored by observation and Patrick [name] 's daily report of symptom reduction.  Emotional and mental status was monitored by daily self-inventory reports completed by Patrick [name] and clinical staff.          Patrick [name] was evaluated by the treatment team for stability and plans for continued recovery upon discharge. Patrick [name] 's motivation was an integral factor for scheduling further treatment. Employment, transportation, bed availability, health status, family support, and any pending legal issues were also considered during hospital stay. Pt was offered further treatment options upon discharge including but not limited to Residential, Intensive Outpatient, and Outpatient treatment.  Patrick [name] will follow up with the services as listed below under Follow Up Information.     Upon completion of this admission the patient was both mentally and medically stable for discharge denying suicidal/homicidal ideation, auditory/visual/tactile hallucinations, delusional thoughts and paranoia.    Patrick [name] responded well to treatment with Zoloft 25 mg and Zyprexa 10 mg without adverse effects. Pt demonstrated improvement without reported or observed adverse effects to the point of stability appropriate for outpatient management. Pertinent labs include:THC  for which outpatient follow-up is necessary for lab recheck as mentioned below. Reviewed CBC, CMP, BAL, and UDS+ THC; all unremarkable aside from noted exceptions.   Physical Findings: AIMS:  , ,  ,  ,    CIWA:    COWS:     Musculoskeletal: Strength & Muscle Tone: within normal limits Gait & Station: normal Patient leans: N/A  Psychiatric Specialty Exam: See SRA by MD  Physical Exam  Vitals reviewed. Psychiatric: He has a normal mood and affect. His behavior is normal.    Review of Systems  Psychiatric/Behavioral: Positive for depression. The patient is nervous/anxious.   All other systems reviewed and are negative.   Blood pressure 124/67, pulse 97, temperature 98.5 F (36.9 C), temperature source Oral, resp. rate 16, height 5\' 10"  (1.778 m), weight 61.7 kg, SpO2 99 %.Body mass index is 19.51 kg/m.   Have you used any form of  tobacco in the last 30 days? (Cigarettes, Smokeless Tobacco, Cigars, and/or Pipes): Yes  Has this patient used any form of tobacco in the last 30 days? (Cigarettes, Smokeless Tobacco, Cigars, and/or Pipes) Yes, Yes, A prescription for an FDA-approved tobacco cessation medication was offered at discharge and the patient refused  Blood Alcohol level:  Lab Results  Component Value Date   ETH <10 04/08/2019   ETH 11 (H) 07/27/2018    Metabolic Disorder Labs:  No results found for: HGBA1C, MPG No results found for: PROLACTIN No results found for: CHOL, TRIG, HDL, CHOLHDL, VLDL, LDLCALC  See Psychiatric Specialty Exam and Suicide Risk Assessment completed by Attending Physician prior to discharge.  Discharge destination:  Home  Is patient on multiple antipsychotic therapies at discharge:  No   Has Patient had three or more failed  trials of antipsychotic monotherapy by history:  No  Recommended Plan for Multiple Antipsychotic Therapies: NA  Discharge Instructions    Diet - low sodium heart healthy   Complete by: As directed    Discharge instructions   Complete by: As directed    Take all medications as prescribed. Keep all follow-up appointments as scheduled.  Do not consume alcohol or use illegal drugs while on prescription medications. Report any adverse effects from your medications to your primary care provider promptly.  In the event of recurrent symptoms or worsening symptoms, call 911, a crisis hotline, or go to the nearest emergency department for evaluation.   Increase activity slowly   Complete by: As directed      Allergies as of 04/11/2019      Reactions   Bee Venom Anaphylaxis   unknown      Medication List    TAKE these medications     Indication  hydrOXYzine 25 MG tablet Commonly known as: ATARAX/VISTARIL Take 1 tablet (25 mg total) by mouth 3 (three) times daily as needed for anxiety.  Indication: Feeling Anxious, State of Being Sedated   OLANZapine zydis  10 MG disintegrating tablet Commonly known as: ZYPREXA Take 1 tablet (10 mg total) by mouth at bedtime.  Indication: Manic Phase of Manic-Depression   sertraline 25 MG tablet Commonly known as: ZOLOFT Take 1 tablet (25 mg total) by mouth daily. Start taking on: April 12, 2019  Indication: Major Depressive Disorder, Obsessive Compulsive Disorder   traZODone 50 MG tablet Commonly known as: DESYREL Take 1 tablet (50 mg total) by mouth at bedtime as needed for sleep.  Indication: Anxiety Disorder      Follow-up Information    [facility] Follow up.   Why: Hospital follow up appointment has been requested.  Social worker will contact you on Monday to give you information about date/time.  On the day of appointment, the provider will contact you.  Contact information: [place] [id number] ph: [phone] fx: [phone]          Follow-up recommendations:  Activity:  as tolerated Diet:  heart healthy  Comments:  Take all medications as prescribed. Keep all follow-up appointments as scheduled.  Do not consume alcohol or use illegal drugs while on prescription medications. Report any adverse effects from your medications to your primary care provider promptly.  In the event of recurrent symptoms or worsening symptoms, call 911, a crisis hotline, or go to the nearest emergency department for evaluation.   Signed: Derrill Center, NP 04/11/2019, 9:03 AM

## 2019-04-11 NOTE — Progress Notes (Signed)
  [facility] Case Management Discharge Plan :  Will you be returning to the same living situation after discharge:  Yes,  with mother At discharge, do you have transportation home?: Yes,  mother to pick up Do you have the ability to pay for your medications: Yes,  income and insurance  Release of information consent forms completed and sent to Medical Records.  Patient to Follow up at: Follow-up Information    [facility] Follow up.   Why: Hospital follow up appointment has been requested.  Social worker will contact you Monday at [phone] and Mother at [phone] about date/time.  On day of appointment, the provider will contact you VIRTUALLY. Contact information: [place] [address] ph: [phone] fx: [phone]          Next level of care provider has access to [facility] and Suicide Prevention discussed: Yes,  with mother Earlean Polka [phone]   Have you used any form of tobacco in the last 30 days? (Cigarettes, Smokeless Tobacco, Cigars, and/or Pipes): Yes  Has patient been referred to the Quitline?: Patient refused referral  Patient has been referred for addiction treatment: Yes  Maretta Los, LCSW 04/11/2019, 9:39 AM

## 2019-04-11 NOTE — BHH Suicide Risk Assessment (Signed)
[facility] Discharge Suicide Risk Assessment   Principal Problem: <principal problem not specified> Discharge Diagnoses: Active Problems:   MDD (major depressive disorder)   Total Time spent with patient: 20 minutes  Musculoskeletal: Strength & Muscle Tone: within normal limits Gait & Station: normal Patient leans: N/A  Psychiatric Specialty Exam: Review of Systems  All other systems reviewed and are negative.   Blood pressure 124/67, pulse 97, temperature 98.5 F (36.9 C), temperature source Oral, resp. rate 16, height 5\' 10"  (1.778 m), weight 61.7 kg, SpO2 99 %.Body mass index is 19.51 kg/m.  General Appearance: Casual  Eye Contact::  Good  Speech:  Normal Rate409  Volume:  Normal  Mood:  Euthymic  Affect:  Congruent  Thought Process:  Coherent and Descriptions of Associations: Intact  Orientation:  Full (Time, Place, and Person)  Thought Content:  Logical  Suicidal Thoughts:  No  Homicidal Thoughts:  No  Memory:  Immediate;   Good Recent;   Good Remote;   Good  Judgement:  Intact  Insight:  Fair  Psychomotor Activity:  Normal  Concentration:  Good  Recall:  Good  Fund of Knowledge:Good  Language: Good  Akathisia:  Negative  Handed:  Right  AIMS (if indicated):     Assets:  Desire for Improvement Resilience Social Support Talents/Skills  Sleep:  Number of Hours: 5.75  Cognition: WNL  ADL's:  Intact   Mental Status Per Nursing Assessment::   On Admission:  Suicidal ideation indicated by others, Suicide plan  Demographic Factors:  Male and Adolescent or young adult  Loss Factors: NA  Historical Factors: Impulsivity  Risk Reduction Factors:   Responsible for children under 62 years of age, Sense of responsibility to family, Employed, Living with another person, especially a relative and Positive social support  Continued Clinical Symptoms:  Depression:   Impulsivity More than one psychiatric diagnosis Previous Psychiatric Diagnoses and  Treatments  Cognitive Features That Contribute To Risk:  None    Suicide Risk:  Minimal: No identifiable suicidal ideation.  Patients presenting with no risk factors but with morbid ruminations; may be classified as minimal risk based on the severity of the depressive symptoms  Follow-up Information    [facility] Follow up.   Why: Hospital follow up appointment has been requested.  Social worker will contact you on Monday to give you information about date/time.  On the day of appointment, the provider will contact you.  Contact information: [place] [address] ph: [phone] fx: [phone]          Plan Of Care/Follow-up recommendations:  Activity:  ad lib  Sharma Covert, MD 04/11/2019, 8:47 AM

## 2019-04-11 NOTE — Progress Notes (Signed)
Pt received both verbal and written discharge instructions. Pt verbalized understanding of d/c instructions. Pt agreed to f/u appt and med regimen. Pt received a prescriptions, SRA, AVS and a transitional record. Pt safely discharged to the lobby.

## 2019-04-11 NOTE — [facility] Group Notes (Signed)
[facility] LCSW Group Therapy Note  Date/Time:  04/11/2019  11:00AM-12:00PM  Type of Therapy and Topic:  Group Therapy:  Music and Mood  Participation Level:  Active   Description of Group: In this process group, members listened to a variety of genres of music and identified that different types of music evoke different responses.  Patients were encouraged to identify music that was soothing for them and music that was energizing for them.  Patients discussed how this knowledge can help with wellness and recovery in various ways including managing depression and anxiety as well as encouraging healthy sleep habits.    Therapeutic Goals: 1. Patients will explore the impact of different varieties of music on mood 2. Patients will verbalize the thoughts they have when listening to different types of music 3. Patients will identify music that is soothing to them as well as music that is energizing to them 4. Patients will discuss how to use this knowledge to assist in maintaining wellness and recovery 5. Patients will explore the use of music as a coping skill  Summary of Patient Progress:  At the beginning of group, patient expressed that he felt "happy."  At the end of group, patient expressed that he felt "chillin'."  He participated and requested a song that was played.    Therapeutic Modalities: Solution Focused Brief Therapy Activity   Selmer Dominion, LCSW

## 2019-04-11 NOTE — Progress Notes (Signed)
   04/11/19 0831  Psych Admission Type (Psych Patients Only)  Admission Status Involuntary  Psychosocial Assessment  Patient Complaints None  Eye Contact Fair  Facial Expression Anxious  Affect Appropriate to circumstance  Speech Logical/coherent  Interaction Assertive  Motor Activity Slow  Appearance/Hygiene Unremarkable  Behavior Characteristics Appropriate to situation  Mood Euthymic  Aggressive Behavior  Effect No apparent injury  Thought Process  Coherency WDL  Content WDL  Delusions None reported or observed  Perception WDL  Hallucination None reported or observed  Judgment WDL  Confusion None  Danger to Self  Current suicidal ideation? Denies  Danger to Others  Danger to Others None reported or observed  Danger to Others Abnormal  Harmful Behavior to others No threats or harm toward other people  Destructive Behavior No threats or harm toward property

## 2019-05-25 ENCOUNTER — Ambulatory Visit: Payer: 59 | Attending: Internal Medicine

## 2019-05-25 DIAGNOSIS — Z20822 Contact with and (suspected) exposure to covid-19: Secondary | ICD-10-CM

## 2019-05-27 LAB — NOVEL CORONAVIRUS, NAA: SARS-CoV-2, NAA: NOT DETECTED

## 2019-07-07 ENCOUNTER — Ambulatory Visit: Payer: 59 | Attending: Internal Medicine

## 2019-07-07 DIAGNOSIS — Z20822 Contact with and (suspected) exposure to covid-19: Secondary | ICD-10-CM

## 2019-07-08 LAB — NOVEL CORONAVIRUS, NAA: SARS-CoV-2, NAA: NOT DETECTED

## 2019-10-23 ENCOUNTER — Emergency Department (HOSPITAL_COMMUNITY): Payer: 59

## 2019-10-23 ENCOUNTER — Emergency Department (HOSPITAL_COMMUNITY)
Admission: EM | Admit: 2019-10-23 | Discharge: 2019-10-23 | Disposition: A | Discharge status: Home / Self Care | Payer: 59 | Attending: Emergency Medicine | Admitting: Emergency Medicine

## 2019-10-23 ENCOUNTER — Encounter (HOSPITAL_COMMUNITY): Payer: Self-pay

## 2019-10-23 ENCOUNTER — Other Ambulatory Visit: Payer: Self-pay

## 2019-10-23 DIAGNOSIS — M79641 Pain in right hand: Secondary | ICD-10-CM | POA: Diagnosis not present

## 2019-10-23 DIAGNOSIS — F17210 Nicotine dependence, cigarettes, uncomplicated: Secondary | ICD-10-CM | POA: Insufficient documentation

## 2019-10-23 DIAGNOSIS — W51XXXA Accidental striking against or bumped into by another person, initial encounter: Secondary | ICD-10-CM | POA: Insufficient documentation

## 2019-10-23 DIAGNOSIS — Y929 Unspecified place or not applicable: Secondary | ICD-10-CM | POA: Insufficient documentation

## 2019-10-23 DIAGNOSIS — Y999 Unspecified external cause status: Secondary | ICD-10-CM | POA: Diagnosis not present

## 2019-10-23 DIAGNOSIS — Y939 Activity, unspecified: Secondary | ICD-10-CM | POA: Diagnosis not present

## 2019-10-23 DIAGNOSIS — Z23 Encounter for immunization: Secondary | ICD-10-CM | POA: Diagnosis not present

## 2019-10-23 DIAGNOSIS — Z79899 Other long term (current) drug therapy: Secondary | ICD-10-CM | POA: Diagnosis not present

## 2019-10-23 MED ORDER — AMOXICILLIN-POT CLAVULANATE 875-125 MG PO TABS: 1.0000 | ORAL_TABLET | Freq: Two times a day (BID) | ORAL | 0 refills | Status: DC

## 2019-10-23 MED ORDER — TETANUS-DIPHTH-ACELL PERTUSSIS 5-2.5-18.5 LF-MCG/0.5 IM SUSP
0.5000 mL | Freq: Once | INTRAMUSCULAR | Status: AC
  Administered 2019-10-23: 0.5 mL via INTRAMUSCULAR
  Filled 2019-10-23: qty 0.5
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
## 2019-10-23 NOTE — ED Notes (Signed)
Pt transported to XRAY °

## 2019-10-27 ENCOUNTER — Other Ambulatory Visit: Payer: Self-pay

## 2019-10-27 ENCOUNTER — Inpatient Hospital Stay (HOSPITAL_COMMUNITY)
Admission: EM | Admit: 2019-10-27 | Discharge: 2019-11-03 | DRG: 917 | Disposition: A | Discharge status: Home / Self Care | Payer: 59 | Attending: Internal Medicine | Admitting: Internal Medicine

## 2019-10-27 ENCOUNTER — Inpatient Hospital Stay (HOSPITAL_COMMUNITY): Payer: 59

## 2019-10-27 ENCOUNTER — Encounter (HOSPITAL_COMMUNITY): Payer: Self-pay | Admitting: Emergency Medicine

## 2019-10-27 ENCOUNTER — Emergency Department (HOSPITAL_COMMUNITY): Payer: 59

## 2019-10-27 ENCOUNTER — Inpatient Hospital Stay (HOSPITAL_COMMUNITY)
Admit: 2019-10-27 | Discharge: 2019-10-27 | Disposition: A | Discharge status: Home / Self Care | Payer: 59 | Attending: Pulmonary Disease | Admitting: Pulmonary Disease

## 2019-10-27 DIAGNOSIS — J9601 Acute respiratory failure with hypoxia: Secondary | ICD-10-CM | POA: Diagnosis not present

## 2019-10-27 DIAGNOSIS — F1910 Other psychoactive substance abuse, uncomplicated: Secondary | ICD-10-CM | POA: Diagnosis present

## 2019-10-27 DIAGNOSIS — Z915 Personal history of self-harm: Secondary | ICD-10-CM

## 2019-10-27 DIAGNOSIS — Z79899 Other long term (current) drug therapy: Secondary | ICD-10-CM

## 2019-10-27 DIAGNOSIS — Z91030 Bee allergy status: Secondary | ICD-10-CM | POA: Diagnosis not present

## 2019-10-27 DIAGNOSIS — R4182 Altered mental status, unspecified: Secondary | ICD-10-CM | POA: Diagnosis not present

## 2019-10-27 DIAGNOSIS — Z9119 Patient's noncompliance with other medical treatment and regimen: Secondary | ICD-10-CM

## 2019-10-27 DIAGNOSIS — R092 Respiratory arrest: Secondary | ICD-10-CM

## 2019-10-27 DIAGNOSIS — T50902A Poisoning by unspecified drugs, medicaments and biological substances, intentional self-harm, initial encounter: Secondary | ICD-10-CM | POA: Diagnosis not present

## 2019-10-27 DIAGNOSIS — G249 Dystonia, unspecified: Secondary | ICD-10-CM | POA: Diagnosis present

## 2019-10-27 DIAGNOSIS — R000 Tachycardia, unspecified: Secondary | ICD-10-CM | POA: Diagnosis present

## 2019-10-27 DIAGNOSIS — Y929 Unspecified place or not applicable: Secondary | ICD-10-CM

## 2019-10-27 DIAGNOSIS — F17290 Nicotine dependence, other tobacco product, uncomplicated: Secondary | ICD-10-CM | POA: Diagnosis present

## 2019-10-27 DIAGNOSIS — R451 Restlessness and agitation: Secondary | ICD-10-CM | POA: Diagnosis present

## 2019-10-27 DIAGNOSIS — F419 Anxiety disorder, unspecified: Secondary | ICD-10-CM | POA: Diagnosis present

## 2019-10-27 DIAGNOSIS — F329 Major depressive disorder, single episode, unspecified: Secondary | ICD-10-CM | POA: Diagnosis present

## 2019-10-27 DIAGNOSIS — R404 Transient alteration of awareness: Secondary | ICD-10-CM | POA: Diagnosis present

## 2019-10-27 DIAGNOSIS — G92 Toxic encephalopathy: Secondary | ICD-10-CM | POA: Diagnosis present

## 2019-10-27 DIAGNOSIS — M436 Torticollis: Secondary | ICD-10-CM | POA: Diagnosis present

## 2019-10-27 DIAGNOSIS — Z8249 Family history of ischemic heart disease and other diseases of the circulatory system: Secondary | ICD-10-CM | POA: Diagnosis not present

## 2019-10-27 DIAGNOSIS — Z4659 Encounter for fitting and adjustment of other gastrointestinal appliance and device: Secondary | ICD-10-CM

## 2019-10-27 DIAGNOSIS — Z653 Problems related to other legal circumstances: Secondary | ICD-10-CM | POA: Diagnosis not present

## 2019-10-27 DIAGNOSIS — T424X2A Poisoning by benzodiazepines, intentional self-harm, initial encounter: Principal | ICD-10-CM | POA: Diagnosis present

## 2019-10-27 DIAGNOSIS — Z20822 Contact with and (suspected) exposure to covid-19: Secondary | ICD-10-CM | POA: Diagnosis present

## 2019-10-27 DIAGNOSIS — R0689 Other abnormalities of breathing: Secondary | ICD-10-CM | POA: Diagnosis not present

## 2019-10-27 DIAGNOSIS — F1290 Cannabis use, unspecified, uncomplicated: Secondary | ICD-10-CM | POA: Diagnosis present

## 2019-10-27 DIAGNOSIS — T50902D Poisoning by unspecified drugs, medicaments and biological substances, intentional self-harm, subsequent encounter: Secondary | ICD-10-CM | POA: Diagnosis not present

## 2019-10-27 DIAGNOSIS — G2402 Drug induced acute dystonia: Secondary | ICD-10-CM | POA: Diagnosis not present

## 2019-10-27 LAB — ACETAMINOPHEN LEVEL: Acetaminophen (Tylenol), Serum: <10 ug/mL — ABNORMAL LOW (ref 10–30)

## 2019-10-27 LAB — GLUCOSE, CAPILLARY
Glucose-Capillary: 98 mg/dL (ref 70–99)
Glucose-Capillary: 85 mg/dL (ref 70–99)
Glucose-Capillary: 149 mg/dL — ABNORMAL HIGH (ref 70–99)
Glucose-Capillary: 87 mg/dL (ref 70–99)

## 2019-10-27 LAB — CBC WITH DIFFERENTIAL/PLATELET
Abs Immature Granulocytes: 0.01 10*3/uL (ref 0.00–0.07)
Basophils Absolute: 0 10*3/uL (ref 0.0–0.1)
Basophils Relative: 1 %
Eosinophils Absolute: 0.1 10*3/uL (ref 0.0–0.5)
Eosinophils Relative: 2 %
HCT: 42.7 % (ref 39.0–52.0)
Hemoglobin: 14.1 g/dL (ref 13.0–17.0)
Immature Granulocytes: 0 %
Lymphocytes Relative: 14 %
Lymphs Abs: 0.8 10*3/uL (ref 0.7–4.0)
MCH: 27.1 pg (ref 26.0–34.0)
MCHC: 33 g/dL (ref 30.0–36.0)
MCV: 82 fL (ref 80.0–100.0)
Monocytes Absolute: 0.5 10*3/uL (ref 0.1–1.0)
Monocytes Relative: 8 %
Neutro Abs: 4.1 10*3/uL (ref 1.7–7.7)
Neutrophils Relative %: 75 %
Platelets: 200 10*3/uL (ref 150–400)
RBC: 5.21 MIL/uL (ref 4.22–5.81)
RDW: 13.6 % (ref 11.5–15.5)
WBC: 5.5 10*3/uL (ref 4.0–10.5)
nRBC: 0 % (ref 0.0–0.2)

## 2019-10-27 LAB — SARS CORONAVIRUS 2 BY RT PCR (HOSPITAL ORDER, PERFORMED IN ~~LOC~~ HOSPITAL LAB): SARS Coronavirus 2: NEGATIVE

## 2019-10-27 LAB — COMPREHENSIVE METABOLIC PANEL
ALT: 14 U/L (ref 0–44)
AST: 18 U/L (ref 15–41)
Albumin: 4.5 g/dL (ref 3.5–5.0)
Alkaline Phosphatase: 61 U/L (ref 38–126)
Anion gap: 12 (ref 5–15)
BUN: 11 mg/dL (ref 6–20)
CO2: 20 mmol/L — ABNORMAL LOW (ref 22–32)
Calcium: 8.7 mg/dL — ABNORMAL LOW (ref 8.9–10.3)
Chloride: 105 mmol/L (ref 98–111)
Creatinine, Ser: 0.88 mg/dL (ref 0.61–1.24)
GFR calc Af Amer: >60 mL/min (ref >60)
GFR calc non Af Amer: >60 mL/min (ref >60)
Glucose, Bld: 125 mg/dL — ABNORMAL HIGH (ref 70–99)
Potassium: 3.6 mmol/L (ref 3.5–5.1)
Sodium: 137 mmol/L (ref 135–145)
Total Bilirubin: 0.8 mg/dL (ref 0.3–1.2)
Total Protein: 7.1 g/dL (ref 6.5–8.1)

## 2019-10-27 LAB — URINALYSIS, ROUTINE W REFLEX MICROSCOPIC
Bilirubin Urine: NEGATIVE
Glucose, UA: NEGATIVE mg/dL
Hgb urine dipstick: NEGATIVE
Ketones, ur: 5 mg/dL — AB
Leukocytes,Ua: NEGATIVE
Nitrite: NEGATIVE
Protein, ur: NEGATIVE mg/dL
Specific Gravity, Urine: 1.014 (ref 1.005–1.030)
pH: 5 (ref 5.0–8.0)

## 2019-10-27 LAB — RAPID URINE DRUG SCREEN, HOSP PERFORMED
Amphetamines: POSITIVE — AB
Barbiturates: NOT DETECTED
Benzodiazepines: POSITIVE — AB
Cocaine: NOT DETECTED
Opiates: NOT DETECTED
Tetrahydrocannabinol: POSITIVE — AB

## 2019-10-27 LAB — MAGNESIUM: Magnesium: 1.9 mg/dL (ref 1.7–2.4)

## 2019-10-27 LAB — BLOOD GAS, ARTERIAL
Acid-base deficit: 2.3 mmol/L — ABNORMAL HIGH (ref 0.0–2.0)
Bicarbonate: 21.5 mmol/L (ref 20.0–28.0)
FIO2: 60
O2 Saturation: 99.2 %
Patient temperature: 96.6
pCO2 arterial: 34.2 mmHg (ref 32.0–48.0)
pH, Arterial: 7.41 (ref 7.350–7.450)
pO2, Arterial: 322 mmHg — ABNORMAL HIGH (ref 83.0–108.0)

## 2019-10-27 LAB — CBG MONITORING, ED: Glucose-Capillary: 120 mg/dL — ABNORMAL HIGH (ref 70–99)

## 2019-10-27 LAB — SALICYLATE LEVEL: Salicylate Lvl: <7 mg/dL — ABNORMAL LOW (ref 7.0–30.0)

## 2019-10-27 LAB — ETHANOL: Alcohol, Ethyl (B): <10 mg/dL (ref <10)

## 2019-10-27 LAB — MRSA PCR SCREENING: MRSA by PCR: NEGATIVE

## 2019-10-27 LAB — HIV ANTIBODY (ROUTINE TESTING W REFLEX): HIV Screen 4th Generation wRfx: NONREACTIVE

## 2019-10-27 MED ORDER — PRO-STAT SUGAR FREE PO LIQD
30.0000 mL | Freq: Every day | ORAL | Status: DC
  Administered 2019-10-27: 30 mL
  Filled 2019-10-27 (×2): qty 30

## 2019-10-27 MED ORDER — VECURONIUM BROMIDE 10 MG IV SOLR: 10.0000 mg | Freq: Once | INTRAVENOUS | Status: AC

## 2019-10-27 MED ORDER — PANTOPRAZOLE SODIUM 40 MG IV SOLR: 40.0000 mg | Freq: Every day | INTRAVENOUS | Status: DC

## 2019-10-27 MED ORDER — VITAL AF 1.2 CAL PO LIQD
1000.0000 mL | ORAL | Status: DC
  Administered 2019-10-27: 1000 mL

## 2019-10-27 MED ORDER — VECURONIUM BROMIDE 10 MG IV SOLR
INTRAVENOUS | Status: AC
  Administered 2019-10-27: 10 mg via INTRAVENOUS
  Filled 2019-10-27: qty 10

## 2019-10-27 MED ORDER — ROCURONIUM BROMIDE 10 MG/ML (PF) SYRINGE
PREFILLED_SYRINGE | INTRAVENOUS | Status: AC
  Administered 2019-10-27: 100 mg
  Filled 2019-10-27: qty 10

## 2019-10-27 MED ORDER — ETOMIDATE 2 MG/ML IV SOLN
INTRAVENOUS | Status: AC
  Administered 2019-10-27: 20 mg
  Filled 2019-10-27: qty 10

## 2019-10-27 MED ORDER — LACTATED RINGERS IV SOLN: INTRAVENOUS | Status: DC

## 2019-10-27 MED ORDER — DOCUSATE SODIUM 50 MG/5ML PO LIQD: 100.0000 mg | Freq: Two times a day (BID) | ORAL | Status: DC

## 2019-10-27 MED ORDER — HEPARIN SODIUM (PORCINE) 5000 UNIT/ML IJ SOLN
5000.0000 [IU] | Freq: Three times a day (TID) | INTRAMUSCULAR | Status: DC
  Administered 2019-10-27 – 2019-10-28 (×5): 5000 [IU] via SUBCUTANEOUS
  Filled 2019-10-27 (×6): qty 1

## 2019-10-27 MED ORDER — PROPOFOL 1000 MG/100ML IV EMUL
0.0000 ug/kg/min | INTRAVENOUS | Status: DC
  Administered 2019-10-27 (×3): 50 ug/kg/min via INTRAVENOUS
  Administered 2019-10-27: 5 ug/kg/min via INTRAVENOUS
  Administered 2019-10-28: 40 ug/kg/min via INTRAVENOUS
  Filled 2019-10-27 (×6): qty 100

## 2019-10-27 MED ORDER — PANTOPRAZOLE SODIUM 40 MG PO PACK
40.0000 mg | PACK | ORAL | Status: DC
  Administered 2019-10-27: 40 mg
  Filled 2019-10-27 (×2): qty 20

## 2019-10-27 MED ORDER — POLYETHYLENE GLYCOL 3350 17 G PO PACK: 17.0000 g | PACK | Freq: Every day | ORAL | Status: DC

## 2019-10-27 MED ORDER — FENTANYL 2500MCG IN NS 250ML (10MCG/ML) PREMIX INFUSION: 0.0000 ug/h | INTRAVENOUS | Status: DC

## 2019-10-27 MED ORDER — CHLORHEXIDINE GLUCONATE 0.12% ORAL RINSE (MEDLINE KIT)
15.0000 mL | Freq: Two times a day (BID) | OROMUCOSAL | Status: DC
  Administered 2019-10-27 – 2019-10-28 (×3): 15 mL via OROMUCOSAL

## 2019-10-27 MED ORDER — FREE WATER
100.0000 mL | Status: DC
  Administered 2019-10-27 – 2019-10-28 (×5): 100 mL

## 2019-10-27 MED ORDER — FENTANYL CITRATE (PF) 100 MCG/2ML IJ SOLN
50.0000 ug | INTRAMUSCULAR | Status: DC | PRN
  Administered 2019-10-27 (×5): 100 ug via INTRAVENOUS
  Filled 2019-10-27 (×6): qty 2

## 2019-10-27 MED ORDER — ORAL CARE MOUTH RINSE
15.0000 mL | OROMUCOSAL | Status: DC
  Administered 2019-10-27 – 2019-10-28 (×10): 15 mL via OROMUCOSAL

## 2019-10-27 MED ORDER — CHLORHEXIDINE GLUCONATE CLOTH 2 % EX PADS
6.0000 | MEDICATED_PAD | Freq: Every day | CUTANEOUS | Status: DC
  Administered 2019-10-27 – 2019-10-28 (×2): 6 via TOPICAL

## 2019-10-27 MED ORDER — FENTANYL CITRATE (PF) 100 MCG/2ML IJ SOLN: 50.0000 ug | INTRAMUSCULAR | Status: DC | PRN

## 2019-10-27 MED ORDER — OSMOLITE 1.2 CAL PO LIQD
1000.0000 mL | ORAL | Status: AC
  Administered 2019-10-27: 1000 mL

## 2019-10-27 NOTE — Progress Notes (Signed)
Notified CCM NP Canary Brim in regards to patient needing repeat abdominal x-ray due to patient's OG tube coiled in esophagus, which is seen on previous chest x-ray and abdominal scan. Removed previous OG and replace d. CCM NP Canary Brim ordered for repeat abdominal x-ray.
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
## 2019-10-27 NOTE — Progress Notes (Signed)
Mother took patient home medications and belongings back home.

## 2019-10-27 NOTE — Progress Notes (Signed)
ABG obtained on PRVC 580, 16, 5 and 60%  Results for WARDELL, POKORSKI (MRN [id number]) as of 10/27/2019 05:09  Ref. Range 10/27/2019 04:19  FIO2 Unknown 60.00  pH, Arterial Latest Ref Range: 7.350 - 7.450  7.410  pCO2 arterial Latest Ref Range: 32.0 - 48.0 mmHg 34.2  pO2, Arterial Latest Ref Range: 83.0 - 108.0 mmHg 322 (H)  Acid-base deficit Latest Ref Range: 0.0 - 2.0 mmol/L 2.3 (H)  Bicarbonate Latest Ref Range: 20.0 - 28.0 mmol/L 21.5  O2 Saturation Latest Units: % 99.2  Patient temperature Unknown 96.6  Amphetamines Latest Ref Range: NONE DETECTED  POSITIVE (A)  Barbiturates Latest Ref Range: NONE DETECTED  NONE DETECTED  Benzodiazepines Latest Ref Range: NONE DETECTED  POSITIVE (A)  Opiates Latest Ref Range: NONE DETECTED  NONE DETECTED  COCAINE Latest Ref Range: NONE DETECTED  NONE DETECTED  Tetrahydrocannabinol Latest Ref Range: NONE DETECTED  POSITIVE (A)

## 2019-10-27 NOTE — ED Notes (Signed)
Report given to [place], RN on [facility]. Explained why patients propofol drip was exceeding limits. When this RN assessd patient at 0700 drip was running at 120 mcg  With BP soft and this RN has been slowly tritrating back down.

## 2019-10-27 NOTE — Progress Notes (Signed)
Notified MD Sood in regards to patient having low grade temperatures. Also notified MD Craige Cotta that patient was having yellow/white discharge from urethral meatus of penis. Will watch for new orders.

## 2019-10-27 NOTE — H&P (Signed)
NAME:  Patrick [name], MRN:  [id number], DOB:  [birth year], LOS: 0 ADMISSION DATE:  10/27/2019, CONSULTATION DATE:  10/27/2019 REFERRING MD:  Dr. Randal Buba, CHIEF COMPLAINT:  Overdose   History of present illness   19 year old male presents to [facility] ED on 5/26 with [employer] due to a drug overdose. When EMS arrived patient was lethargic and combative. Given 5 mg versed and 5 mg haldol on route to facility. Per report patient took unknown amounts of Trazodone, Zyprexa, Zoloft, Atarax in attempts to commit suicide. On arrival to ED patient is unresponsive, Bradypnea, tachycardiac with a GCS of 3. Intubated for airway protection. Head CT negative for acute. Critical Care asked to admit.   Past Medical History  Polysubstance Abuse  Significant Hospital Events   5/26 > Presents to ED   Consults:  PCCM  Procedures:  ETT 5/26 >>   Significant Diagnostic Tests:  CT Head 5/26 >> no acute intracranial process CT C Spine 5/26 >> no fractures  Micro Data:  Sputum 5/26 >> U/A 5/26 >>   Antimicrobials:  N/A   Interim history/subjective:  As above   Objective   Blood pressure (!) 142/99, pulse (!) 102, temperature (!) 96.9 F (36.1 C), resp. rate 16, SpO2 100 %.    Vent Mode: PRVC FiO2 (%):  [60 %] 60 % Set Rate:  [16 bmp] 16 bmp Vt Set:  [580 mL] 580 mL PEEP:  [5 cmH20] 5 cmH20 Plateau Pressure:  [12 cmH20] 12 cmH20  No intake or output data in the 24 hours ending 10/27/19 0530 There were no vitals filed for this visit.  Examination: General: Well-built on mechanical ventilation sedated not in distress HENT: Orally intubated Lungs: Clear equal air sounds bilateral no crackles no wheezing Cardiovascular: Normal heart sound no added sounds or murmurs Abdomen: Soft no tenderness no guarding Extremities: No lower limb edema Neuro: Pupils 3 mm sluggish sedated not following commands  Resolved Hospital Problem list     Assessment & Plan:   Acute metabolic encephalopathy in setting of Acute  Drug Overdose in attempt to commit suicide Concern for Seizure Activity  H/O Polysubstance Abuse > UDS +THC and Amphetamines  -Head CT with no  acute intracranial process Plan  -Will Need Psych Consult when medically stable  -Seizure Precautions -Consider Neurology Consult and AED if patient has further seizures I do suspect that this was only agitation as per discussion description by the nurse  Respiratory Insufficieny in setting of above Plan  -Vent Support > Mentation Barrier to Extubation  -Trend ABG/CXR -VAP Prevention Bundle   Best practice:  Diet: NPO Pain/Anxiety/Delirium protocol (if indicated) VAP protocol (if indicated) DVT prophylaxis: Heparin SQ GI prophylaxis: PPI Glucose control: SSI Mobility: Bedrest Code Status: FC Family Communication: No family available Disposition: Admit to the intensive care  Labs   CBC: Recent Labs  Lab 10/27/19 0425  WBC 5.5  NEUTROABS 4.1  HGB 14.1  HCT 42.7  MCV 82.0  PLT 253    Basic Metabolic Panel: Recent Labs  Lab 10/27/19 0425  NA 137  K 3.6  CL 105  CO2 20*  GLUCOSE 125*  BUN 11  CREATININE 0.88  CALCIUM 8.7*  MG 1.9   GFR: Estimated Creatinine Clearance: 114.9 mL/min (by C-G formula based on SCr of 0.88 mg/dL). Recent Labs  Lab 10/27/19 0425  WBC 5.5    Liver Function Tests: Recent Labs  Lab 10/27/19 0425  AST 18  ALT 14  ALKPHOS 61  BILITOT 0.8  PROT 7.1  ALBUMIN 4.5   No results for input(s): LIPASE, AMYLASE in the last 168 hours. No results for input(s): AMMONIA in the last 168 hours.  ABG    Component Value Date/Time   PHART 7.410 10/27/2019 0419   PCO2ART 34.2 10/27/2019 0419   PO2ART 322 (H) 10/27/2019 0419   HCO3 21.5 10/27/2019 0419   ACIDBASEDEF 2.3 (H) 10/27/2019 0419   O2SAT 99.2 10/27/2019 0419     Coagulation Profile: No results for input(s): INR, PROTIME in the last 168 hours.  Cardiac Enzymes: No results for input(s): CKTOTAL, CKMB, CKMBINDEX, TROPONINI in the  last 168 hours.  HbA1C: No results found for: HGBA1C  CBG: Recent Labs  Lab 10/27/19 0417  GLUCAP 120*    Review of Systems:   Could not be obtained due to patient sedation  Past Medical History  He,  has a past medical history of Anxiety and Depressive disorder (10/10/2015).   Surgical History    Past Surgical History:  Procedure Laterality Date  . HERNIA REPAIR       Social History   reports that he has been smoking cigars. He has never used smokeless tobacco. He reports current alcohol use. He reports current drug use. Drug: Marijuana.   Family History   His family history includes Atrial fibrillation in his maternal grandmother; Heart attack in an other family member; Heart failure in his maternal grandmother.   Allergies Allergies  Allergen Reactions  . Bee Venom Anaphylaxis    unknown     Home Medications  Prior to Admission medications   Medication Sig Start Date End Date Taking? Authorizing Provider  amoxicillin-clavulanate (AUGMENTIN) 875-125 MG tablet Take 1 tablet by mouth every 12 (twelve) hours for 5 days. 10/23/19 10/28/19  Mare Ferrari, PA-C  hydrOXYzine (ATARAX/VISTARIL) 25 MG tablet Take 1 tablet (25 mg total) by mouth 3 (three) times daily as needed for anxiety. 04/11/19   Oneta Rack, NP  OLANZapine zydis (ZYPREXA) 10 MG disintegrating tablet Take 1 tablet (10 mg total) by mouth at bedtime. 04/11/19   Oneta Rack, NP  sertraline (ZOLOFT) 25 MG tablet Take 1 tablet (25 mg total) by mouth daily. 04/12/19   Oneta Rack, NP  traZODone (DESYREL) 50 MG tablet Take 1 tablet (50 mg total) by mouth at bedtime as needed for sleep. 04/11/19   Oneta Rack, NP     Critical care time: I have spent 31 minutes of critical care time this time was spent at bedside or in the unit this time was exclusive of any billable procedures patient is needing intensive care due to complex medical problems requiring complex medical decision.

## 2019-10-27 NOTE — Procedures (Signed)
Patient Name: Patrick [name]  MRN: [id number]  Epilepsy Attending: Charlsie Quest  Referring Physician/Provider: Dr Coralyn Helling Date: 10/27/2019 Duration: 23.31 mins  Patient history: 19yo M with ams in setting of intentional overdose. EEG to evaluate for seizure.   Level of alertness:  comatose  AEDs during EEG study: Propofol  Technical aspects: This EEG study was done with scalp electrodes positioned according to the 10-20 International system of electrode placement. Electrical activity was acquired at a sampling rate of 500Hz  and reviewed with a high frequency filter of 70Hz  and a low frequency filter of 1Hz . EEG data were recorded continuously and digitally stored.   Description:  EEG showed continuous generalized low amplitude 2-3Hz  -delta slowing as well as an excessive amount of 15 to 18 Hz beta activity distributed symmetrically and diffusely. Patient was noted to have shivering like movements during the study. Concomitant eeg before, during and after the event didn't show any eeg change to suggest seizure.  Hyperventilation and photic stimulation were not performed.     ABNORMALITY -Excessive beta, generalized -Continuous slow, generalized   IMPRESSION: This study is suggestive of severe diffuse encephalopathy, nonspecific etiology but likely related to sedation. The excessive beta activity seen in the background is most likely due to the effect of benzodiazepine and is a benign EEG pattern. Episodes of shivering like movement were recorded during the study without concomitant eeg change and were not epileptic. No seizures or epileptiform discharges were seen throughout the recording.   Anibal Quinby 

## 2019-10-27 NOTE — Progress Notes (Signed)
NAME:  Patrick [name], MRN:  [id number], DOB:  10/25/2000, LOS: 0 ADMISSION DATE:  10/27/2019, CONSULTATION DATE:  10/27/2019 REFERRING MD:  Dr. Nicanor Alcon, CHIEF COMPLAINT:  Overdose   History of present illness   19 year old male presents to [facility] ED on 5/26 with [employer] due to a drug overdose. When EMS arrived patient was lethargic and combative. Given 5 mg versed and 5 mg haldol on route to facility. Per report patient took unknown amounts of Trazodone, Zyprexa, Zoloft, Atarax in attempts to commit suicide. On arrival to ED patient is unresponsive, Bradypnea, tachycardiac with a GCS of 3. Intubated for airway protection. Head CT negative for acute. Critical Care asked to admit.   Past Medical History  Polysubstance Abuse, Depression  Significant Hospital Events   5/26 Presents to ED   Consults:    Procedures:  ETT 5/26 >>   Significant Diagnostic Tests:  CT Head 5/26 >> no acute intracranial process CT C Spine 5/26 >> no fractures  Micro Data:  SARS CoV2 PCR 5/26 >> negative Sputum 5/26 >>  Antimicrobials:    Interim history/subjective:  Remains on sedation, vent.  Objective   Blood pressure 109/77, pulse 73, temperature (!) 96.3 F (35.7 C), resp. rate 16, SpO2 99 %.    Vent Mode: PRVC FiO2 (%):  [40 %-60 %] 40 % Set Rate:  [16 bmp] 16 bmp Vt Set:  [580 mL] 580 mL PEEP:  [5 cmH20] 5 cmH20 Plateau Pressure:  [12 cmH20-13 cmH20] 13 cmH20   Intake/Output Summary (Last 24 hours) at 10/27/2019 0958 Last data filed at 10/27/2019 0954 Gross per 24 hour  Intake --  Output 1000 ml  Net -1000 ml   There were no vitals filed for this visit.  Examination:  General - sedated Eyes - pupils reactive ENT - ETT in place Cardiac - regular rate/rhythm, no murmur Chest - equal breath sounds b/l, no wheezing or rales Abdomen - soft, non tender, + bowel sounds Extremities - no cyanosis, clubbing, or edema, ankle bracelet on Skin - no rashes Neuro - RASS -3   Resolved Hospital  Problem list     Assessment & Plan:   Acute metabolic encephalopathy in setting of intentional overdose. Possible seizure in setting of overdose. - UDS positive for THC, benzo's, amphetamines - RASS goal 0 to -1 - EEG - will need psychiatry assessment when medically stable  Acute respiratory failure with hypoxia and compromised airway. - full vent support until neuro status more stable   Best practice:  Diet: tube feeds DVT prophylaxis: Heparin SQ GI prophylaxis: protonix Mobility: Bedrest Code Status: full code Disposition: ICU  Labs:   CMP Latest Ref Rng & Units 10/27/2019 04/08/2019 07/27/2018  Glucose 70 - 99 mg/dL 024(O) 92 973(Z)  BUN 6 - 20 mg/dL 11 12 11   Creatinine 0.61 - 1.24 mg/dL 3.29 9.24  Sodium 135 - 145 mmol/L 137 138 140  Potassium 3.5 - 5.1 mmol/L 3.6 4.3 4.0  Chloride 98 - 111 mmol/L 105 104 107  CO2 22 - 32 mmol/L 20(L) 26 24  Calcium 8.9 - 10.3 mg/dL 2.68) 9.9 9.2  Total Protein 6.5 - 8.1 g/dL 7.1 7.3 7.3  Total Bilirubin 0.3 - 1.2 mg/dL 0.8 0.9 3.4(H)  Alkaline Phos 38 - 126 U/L 61 67 59  AST 15 - 41 U/L 18 18 29   ALT 0 - 44 U/L 14 15 22     CBC Latest Ref Rng & Units 10/27/2019 04/08/2019 07/27/2018  WBC 4.0 -  10.5 K/uL 5.5 3.6(L) 6.8  Hemoglobin 13.0 - 17.0 g/dL 14.1 15.0 13.9  Hematocrit 39.0 - 52.0 % 42.7 46.1 43.9  Platelets 150 - 400 K/uL 200 250 239    CBG (last 3)  Recent Labs    10/27/19 0417  GLUCAP 120*    ABG    Component Value Date/Time   PHART 7.410 10/27/2019 0419   PCO2ART 34.2 10/27/2019 0419   PO2ART 322 (H) 10/27/2019 0419   HCO3 21.5 10/27/2019 0419   ACIDBASEDEF 2.3 (H) 10/27/2019 0419   O2SAT 99.2 10/27/2019 0419    Critical care time: 34 minutes   Chesley Mires, MD [place] Pager - [phone] 10/27/2019, 10:06 AM

## 2019-10-27 NOTE — ED Triage Notes (Signed)
Patient in by [facility]  Along with [employer] due to a possible overdose, patient lethargic on EMS arrival and then became combative, had to be given versed 5mg  and haldol 5mg  in route to facility. Possible seizure activity, patient posturing on arrival.

## 2019-10-27 NOTE — Progress Notes (Signed)
EEG completed, results pending. 

## 2019-10-27 NOTE — Progress Notes (Addendum)
Initial Nutrition Assessment  RD working remotely.   DOCUMENTATION CODES:   Not applicable  INTERVENTION:  - will adjust TF regimen: Vital AF 1.2 @ 45 ml/hr with 30 ml prostat once/day. - this regimen + kcal from current propofol rate provides 1925 kcal (101% estimated kcal need), 96 grams protein, and 1476 ml free water.   NUTRITION DIAGNOSIS:   Inadequate oral intake related to inability to eat as evidenced by NPO status.  GOAL:   Patient will meet greater than or equal to 90% of their needs  MONITOR:   Vent status, TF tolerance, Labs, Weight trends  REASON FOR ASSESSMENT:   Ventilator, Consult Enteral/tube feeding initiation and management  ASSESSMENT:   19 year old male who present to the ED on 5/26 via [employer] d/t drug OD. He was lethargic and combative when EMS arrived. He had taken unknown amounts of trazadone, Zyprexa, Zoloft, and atarx in a suicide attempt. He was intubated for airway protection.  Patient is intubated with NGT in place. Reading from abdominal xray states that tube is looped and the tip of tube is in the distal esophagus; will reach out to RN to determine if tube is now in a safe location for use.  Order in place for Osmolite 1.2 @ 50 ml/hr which provides 1440 kcal, 67 grams protein, and 984 ml free water.   Per chart review, weight on 10/23/19 was 135 lb and weight on 04/08/19 was 136 lb.   Per notes: - acute metabolic encephalopathy in the setting of intentional OD - possible seizure activity   Patient is currently intubated on ventilator support MV: 8.8 L/min Temp (24hrs), Avg:97.1 F (36.2 C), Min:96.1 F (35.6 C), Max:99.7 F (37.6 C) Propofol: 18.36 ml/hr (529 kcal)  Labs reviewed; Ca: 8.7 mg/dl. Medications reviewed. IVF; LR @ 50 ml/hr.  Drips; propofol @ 50 mcg/kg/hr.    NUTRITION - FOCUSED PHYSICAL EXAM:  unable to complete at this time.   Diet Order:   Diet Order    None      EDUCATION NEEDS:   No education needs have  been identified at this time  Skin:  Skin Assessment: Reviewed RN Assessment  Last BM:  PTA/unknown  Height:   Ht Readings from Last 1 Encounters:  10/23/19 5\' 10"  (1.778 m)    Weight:   Wt Readings from Last 1 Encounters:  10/23/19 61.2 kg    Estimated Nutritional Needs:  Kcal:  1899 kcal Protein:  92-105 grams Fluid:  >/= 2 L/day     10/25/19, MS, RD, LDN, CNSC Inpatient Clinical Dietitian RD pager # available in [facility]  After hours/weekend pager # available in [facility]

## 2019-10-27 NOTE — ED Notes (Addendum)
Drip was at when this nurse assessed patient. BP soft. Dose titrated down to @ 29.8 mls.   Hard restraints at bedside. Patient does not currently need restraints.   Spoke with mother and updated on plan of care. Mother will be back later to check on patient.  1 belonging bag at bedside.

## 2019-10-27 NOTE — Progress Notes (Signed)
Pt transported to and from CT on VENT without complication.  RT to monitor and assess as needed.  

## 2019-10-28 ENCOUNTER — Other Ambulatory Visit: Payer: Self-pay

## 2019-10-28 DIAGNOSIS — J9601 Acute respiratory failure with hypoxia: Secondary | ICD-10-CM

## 2019-10-28 LAB — CBC
HCT: 40.9 % (ref 39.0–52.0)
Hemoglobin: 13.1 g/dL (ref 13.0–17.0)
MCH: 26.7 pg (ref 26.0–34.0)
MCHC: 32 g/dL (ref 30.0–36.0)
MCV: 83.3 fL (ref 80.0–100.0)
Platelets: 210 10*3/uL (ref 150–400)
RBC: 4.91 MIL/uL (ref 4.22–5.81)
RDW: 14 % (ref 11.5–15.5)
WBC: 5.9 10*3/uL (ref 4.0–10.5)
nRBC: 0 % (ref 0.0–0.2)

## 2019-10-28 LAB — PHOSPHORUS: Phosphorus: 3.1 mg/dL (ref 2.5–4.6)

## 2019-10-28 LAB — COMPREHENSIVE METABOLIC PANEL
ALT: 13 U/L (ref 0–44)
AST: 15 U/L (ref 15–41)
Albumin: 3.8 g/dL (ref 3.5–5.0)
Alkaline Phosphatase: 50 U/L (ref 38–126)
Anion gap: 10 (ref 5–15)
BUN: 9 mg/dL (ref 6–20)
CO2: 20 mmol/L — ABNORMAL LOW (ref 22–32)
Calcium: 8.6 mg/dL — ABNORMAL LOW (ref 8.9–10.3)
Chloride: 110 mmol/L (ref 98–111)
Creatinine, Ser: 0.83 mg/dL (ref 0.61–1.24)
GFR calc Af Amer: >60 mL/min (ref >60)
GFR calc non Af Amer: >60 mL/min (ref >60)
Glucose, Bld: 93 mg/dL (ref 70–99)
Potassium: 3.5 mmol/L (ref 3.5–5.1)
Sodium: 140 mmol/L (ref 135–145)
Total Bilirubin: 0.8 mg/dL (ref 0.3–1.2)
Total Protein: 6.2 g/dL — ABNORMAL LOW (ref 6.5–8.1)

## 2019-10-28 LAB — GLUCOSE, CAPILLARY
Glucose-Capillary: 91 mg/dL (ref 70–99)
Glucose-Capillary: 106 mg/dL — ABNORMAL HIGH (ref 70–99)
Glucose-Capillary: 87 mg/dL (ref 70–99)

## 2019-10-28 LAB — RPR: RPR Ser Ql: NONREACTIVE

## 2019-10-28 LAB — TRIGLYCERIDES: Triglycerides: 37 mg/dL (ref <150)

## 2019-10-28 LAB — MAGNESIUM: Magnesium: 2.1 mg/dL (ref 1.7–2.4)

## 2019-10-28 MED ORDER — MIDAZOLAM HCL 2 MG/2ML IJ SOLN
INTRAMUSCULAR | Status: AC
  Administered 2019-10-28: 3 mg
  Filled 2019-10-28: qty 6

## 2019-10-28 MED ORDER — POTASSIUM CHLORIDE 20 MEQ/15ML (10%) PO SOLN
40.0000 meq | Freq: Once | ORAL | Status: AC
  Administered 2019-10-28: 40 meq
  Filled 2019-10-28: qty 30

## 2019-10-28 MED ORDER — HALOPERIDOL LACTATE 5 MG/ML IJ SOLN: 5.0000 mg | Freq: Four times a day (QID) | INTRAMUSCULAR | Status: DC | PRN

## 2019-10-28 MED ORDER — HALOPERIDOL 5 MG PO TABS
10.0000 mg | ORAL_TABLET | Freq: Two times a day (BID) | ORAL | Status: DC
  Administered 2019-10-28 – 2019-10-29 (×3): 10 mg via ORAL
  Filled 2019-10-28: qty 2
  Filled 2019-10-28: qty 10
  Filled 2019-10-28 (×2): qty 2

## 2019-10-28 MED ORDER — BENZTROPINE MESYLATE 0.5 MG PO TABS
0.5000 mg | ORAL_TABLET | Freq: Two times a day (BID) | ORAL | Status: DC
  Administered 2019-10-28 – 2019-10-29 (×3): 0.5 mg via ORAL
  Filled 2019-10-28 (×4): qty 1

## 2019-10-28 MED ORDER — DEXMEDETOMIDINE HCL IN NACL 200 MCG/50ML IV SOLN
0.4000 ug/kg/h | INTRAVENOUS | Status: DC
  Administered 2019-10-28: 0.4 ug/kg/h via INTRAVENOUS
  Administered 2019-10-28: 0.5 ug/kg/h via INTRAVENOUS
  Filled 2019-10-28 (×2): qty 50

## 2019-10-28 MED ORDER — MIDAZOLAM HCL 2 MG/2ML IJ SOLN
INTRAMUSCULAR | Status: AC
  Filled 2019-10-28: qty 4

## 2019-10-28 NOTE — TOC Initial Note (Addendum)
Transition of Care [facility]) - Initial/Assessment Note    Patient Details  Name: Patrick [name] MRN: [id number] Date of Birth: 11-25-2000  [facility]) CM/SW Contact:    Golda Acre, RN Phone Number: 10/28/2019, 8:07 AM  Clinical Narrative:                 ivc papers completed and faxed to the magistrate.  Custody order rec'd.  4 copies of all papers made and given to rn in charge of patient.  Dispatch called to serve patient at 1340.Marland Kitchen   Intentional multiple drug overdose.  Comatose and on the vent.. Lives with parents.  Has pcp.  Expected Discharge Plan: Psychiatric Hospital Barriers to Discharge: Continued Medical Work up   Patient Goals and CMS Choice Patient states their goals for this hospitalization and ongoing recovery are:: unable to state on the vent      Expected Discharge Plan and Services Expected Discharge Plan: Psychiatric Hospital   Discharge Planning Services: CM Consult   Living arrangements for the past 2 months: Single Family Home                                      Prior Living Arrangements/Services Living arrangements for the past 2 months: Single Family Home Lives with:: Parents          Need for Family Participation in Patient Care: Yes (Comment) Care giver support system in place?: Yes (comment)   Criminal Activity/Legal Involvement Pertinent to Current Situation/Hospitalization: No - Comment as needed  Activities of Daily Living Home Assistive Devices/Equipment: None ADL Screening (condition at time of admission) Patient's cognitive ability adequate to safely complete daily activities?: No(patient currently intubated) Is the patient deaf or have difficulty hearing?: No Does the patient have difficulty seeing, even when wearing glasses/contacts?: No Does the patient have difficulty concentrating, remembering, or making decisions?: Yes Patient able to express need for assistance with ADLs?: No(patient currently  intubated) Does the patient have difficulty dressing or bathing?: Yes Independently performs ADLs?: No Communication: Dependent Is this a change from baseline?: Change from baseline, expected to last >3 days Dressing (OT): Dependent Is this a change from baseline?: Change from baseline, expected to last >3 days Grooming: Dependent Is this a change from baseline?: Change from baseline, expected to last >3 days Feeding: Dependent Is this a change from baseline?: Change from baseline, expected to last >3 days Bathing: Dependent Is this a change from baseline?: Change from baseline, expected to last >3 days Toileting: Dependent Is this a change from baseline?: Change from baseline, expected to last >3days In/Out Bed: Dependent Is this a change from baseline?: Change from baseline, expected to last >3 days Walks in Home: Dependent Is this a change from baseline?: Change from baseline, expected to last >3 days Does the patient have difficulty walking or climbing stairs?: Yes Weakness of Legs: Both Weakness of Arms/Hands: Both  Permission Sought/Granted                  Emotional Assessment Appearance:: Appears stated age Attitude/Demeanor/Rapport: Other (comment)(conatose) Affect (typically observed): Other (comment) Orientation: : Fluctuating Orientation (Suspected and/or reported Sundowners)   Psych Involvement: Yes (comment)  Admission diagnosis:  Respiratory arrest (HCC) [R09.2] Overdose [T50.901A] Polysubstance abuse (HCC) [F19.10] Intentional drug overdose, initial encounter [facility]) [T50.902A] Patient Active Problem List   Diagnosis Date Noted  . Intentional drug overdose (HCC)   . Acute  hypoxemic respiratory failure ([place])   . Acute respiratory insufficiency   . MDD (major depressive disorder) 04/09/2019  . New onset a-fib ([place]) 07/27/2018  . Nausea & vomiting 07/27/2018  . Intermittent explosive disorder 10/10/2015  . Depressive disorder 10/10/2015  . DMDD  (disruptive mood dysregulation disorder) ([place]) 10/09/2015  . Cannabis use disorder, moderate, dependence ([place]) 10/09/2015   PCP:  Maude Leriche, PA-C Pharmacy:   [facility] [place] [place] [address] Phone: [phone] Fax: [phone]     Social Determinants of Health (SDOH) Interventions    Readmission Risk Interventions No flowsheet data found.

## 2019-10-28 NOTE — Progress Notes (Signed)
Pt extubated with no wean due to agitation. MD and RN at bedside.

## 2019-10-28 NOTE — Consult Note (Signed)
[facility] Face-to-Face Psychiatry Consult   Reason for Consult: "Suicide attempt"  Referring Physician:  Dr. Halford Chessman Patient Identification: Patrick [name] MRN:  [id number] Principal Diagnosis: Intentional drug overdose [facility]) Diagnosis:  Principal Problem:   Intentional drug overdose ([place]) Active Problems:   Acute hypoxemic respiratory failure ([place])   Total Time spent with patient: 45 minutes  Subjective: "I do not know what I took and yes I was trying to get high." Patient states "I am stressed."   HPI: Patrick [name] is a 19 y.o. male patient. Patient assessed by nurse practitioner. Patient alert and oriented. Patient appears irritable during assessment. Patient noted to be currently wearing hand mitts. Patient insists that mother remain at bedside during assessment.  Patient denies ingestion prior to arrival was suicide attempt. Patient reports "I was trying to get high." Patient denies suicidal ideations at this time. Patient appears frustrated during assessment, patient presents with threatening posture. Patient denies history of self-harm behaviors. Patient denies homicidal ideations. Patient denies auditory and visual hallucinations. Patient states "I am stressed." Patient unable to articulate any current stressors. Patient reports he lives with his parents in [place]. Patient denies access to weapons. 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 Patient denies alcohol use. Patient reports marijuana use, daily. Patient reports receiving marijuana from the same supplier recently, patient denies any increase in marijuana use recently. Patient reports he is currently followed by primary care, patient reports "they want me to go to the outpatient psychiatrist but they do not give to F's about me."  Patient does not share current diagnosis. Patient states "I have medications, I take something for sleep, something for anxiety and 2 other pills." Patient appears mildly paranoid. Patient does not give  consent to speak with mother, states "you can talk to her in front of me."     Past Psychiatric History: Disruptive mood dysregulation disorder, depressive disorder, cannabis use disorder, intermittent explosive disorder, major depressive disorder  Risk to Self:  Denies currently-questionable recent attempt Risk to Others:  Denies Prior Inpatient Therapy:  [facility] November 2020 Prior Outpatient Therapy:  Denies  Past Medical History:  Past Medical History:  Diagnosis Date  . Anxiety   . Depressive disorder 10/10/2015    Past Surgical History:  Procedure Laterality Date  . HERNIA REPAIR     Family History:  Family History  Problem Relation Age of Onset  . Heart failure Maternal Grandmother   . Atrial fibrillation Maternal Grandmother   . Heart attack Other        maternal uncle had MI at age 53   Family Psychiatric  History: None reported Social History:  Social History   Substance and Sexual Activity  Alcohol Use Yes     Social History   Substance and Sexual Activity  Drug Use Yes  . Types: Marijuana    Social History   Socioeconomic History  . Marital status: Single    Spouse name: Not on file  . Number of children: Not on file  . Years of education: Not on file  . Highest education level: Not on file  Occupational History  . Not on file  Tobacco Use  . Smoking status: Current Some Day Smoker    Types: Cigars  . Smokeless tobacco: Never Used  Substance and Sexual Activity  . Alcohol use: Yes  . Drug use: Yes    Types: Marijuana  . Sexual activity: Yes    Birth control/protection: None  Other Topics Concern  .  Not on file  Social History Narrative   ** Merged History Encounter **       Social Determinants of Health   Financial Resource Strain:   . Difficulty of Paying Living Expenses:   Food Insecurity:   . Worried About Charity fundraiser in the Last Year:   . Arboriculturist in the Last Year:   Transportation Needs:   . Consulting civil engineer (Medical):   Marland Kitchen Lack of Transportation (Non-Medical):   Physical Activity:   . Days of Exercise per Week:   . Minutes of Exercise per Session:   Stress:   . Feeling of Stress :   Social Connections:   . Frequency of Communication with Friends and Family:   . Frequency of Social Gatherings with Friends and Family:   . Attends Religious Services:   . Active Member of Clubs or Organizations:   . Attends Archivist Meetings:   Marland Kitchen Marital Status:    Additional Social History:    Allergies:   Allergies  Allergen Reactions  . Bee Venom Anaphylaxis    unknown    Labs:  Results for orders placed or performed during the hospital encounter of 10/27/19 (from the past 48 hour(s))  CBG monitoring, ED     Status: Abnormal   Collection Time: 10/27/19  4:17 AM  Result Value Ref Range   Glucose-Capillary 120 (H) 70 - 99 mg/dL    Comment: Glucose reference range applies only to samples taken after fasting for at least 8 hours.  Urine rapid drug screen (hosp performed)     Status: Abnormal   Collection Time: 10/27/19  4:19 AM  Result Value Ref Range   Opiates NONE DETECTED NONE DETECTED   Cocaine NONE DETECTED NONE DETECTED   Benzodiazepines POSITIVE (A) NONE DETECTED   Amphetamines POSITIVE (A) NONE DETECTED   Tetrahydrocannabinol POSITIVE (A) NONE DETECTED   Barbiturates NONE DETECTED NONE DETECTED    Comment: (NOTE) DRUG SCREEN FOR MEDICAL PURPOSES ONLY.  IF CONFIRMATION IS NEEDED FOR ANY PURPOSE, NOTIFY LAB WITHIN 5 DAYS. LOWEST DETECTABLE LIMITS FOR URINE DRUG SCREEN Drug Class                     Cutoff (ng/mL) Amphetamine and metabolites    1000 Barbiturate and metabolites    200 Benzodiazepine                 785 Tricyclics and metabolites     300 Opiates and metabolites        300 Cocaine and metabolites        300 THC                            50 Performed at [facility], [place] [address], [place], [place] [address]   Blood  gas, arterial     Status: Abnormal   Collection Time: 10/27/19  4:19 AM  Result Value Ref Range   FIO2 60.00    pH, Arterial 7.410 7.350 - 7.450   pCO2 arterial 34.2 32.0 - 48.0 mmHg   pO2, Arterial 322 (H) 83.0 - 108.0 mmHg   Bicarbonate 21.5 20.0 - 28.0 mmol/L   Acid-base deficit 2.3 (H) 0.0 - 2.0 mmol/L   O2 Saturation 99.2 %   Patient temperature 96.6     Comment: Performed at [facility], [place] [address] [place], [place] [address]  Comprehensive metabolic panel  Status: Abnormal   Collection Time: 10/27/19  4:25 AM  Result Value Ref Range   Sodium 137 135 - 145 mmol/L   Potassium 3.6 3.5 - 5.1 mmol/L   Chloride 105 98 - 111 mmol/L   CO2 20 (L) 22 - 32 mmol/L   Glucose, Bld 125 (H) 70 - 99 mg/dL    Comment: Glucose reference range applies only to samples taken after fasting for at least 8 hours.   BUN 11 6 - 20 mg/dL   Creatinine, Ser 0.88 0.61 - 1.24 mg/dL   Calcium 8.7 (L) 8.9 - 10.3 mg/dL   Total Protein 7.1 6.5 - 8.1 g/dL   Albumin 4.5 3.5 - 5.0 g/dL   AST 18 15 - 41 U/L   ALT 14 0 - 44 U/L   Alkaline Phosphatase 61 38 - 126 U/L   Total Bilirubin 0.8 0.3 - 1.2 mg/dL   GFR calc non Af Amer >60 >60 mL/min   GFR calc Af Amer >60 >60 mL/min   Anion gap 12 5 - 15    Comment: Performed at [facility], [place] [address], [place], [place] [address]  Salicylate level     Status: Abnormal   Collection Time: 10/27/19  4:25 AM  Result Value Ref Range   Salicylate Lvl <8.7 (L) 7.0 - 30.0 mg/dL    Comment: Performed at [facility], [place] [address] [place], [place] [address]  Acetaminophen level     Status: Abnormal   Collection Time: 10/27/19  4:25 AM  Result Value Ref Range   Acetaminophen (Tylenol), Serum <10 (L) 10 - 30 ug/mL    Comment: (NOTE) Therapeutic concentrations vary significantly. A range of 10-30 ug/mL  may be an effective concentration for many patients. However, some  are best treated at concentrations  outside of this range. Acetaminophen concentrations >150 ug/mL at 4 hours after ingestion  and >50 ug/mL at 12 hours after ingestion are often associated with  toxic reactions. Performed at [facility], [place] [address] [place], [place] [address]   Ethanol     Status: None   Collection Time: 10/27/19  4:25 AM  Result Value Ref Range   Alcohol, Ethyl (B) <10 <10 mg/dL    Comment: (NOTE) Lowest detectable limit for serum alcohol is 10 mg/dL. For medical purposes only. Performed at [facility], [place] [address], [place], [place] [address]   CBC WITH DIFFERENTIAL     Status: None   Collection Time: 10/27/19  4:25 AM  Result Value Ref Range   WBC 5.5 4.0 - 10.5 K/uL   RBC 5.21 4.22 - 5.81 MIL/uL   Hemoglobin 14.1 13.0 - 17.0 g/dL   HCT 42.7 39.0 - 52.0 %   MCV 82.0 80.0 - 100.0 fL   MCH 27.1 26.0 - 34.0 pg   MCHC 33.0 30.0 - 36.0 g/dL   RDW 13.6 11.5 - 15.5 %   Platelets 200 150 - 400 K/uL   nRBC 0.0 0.0 - 0.2 %   Neutrophils Relative % 75 %   Neutro Abs 4.1 1.7 - 7.7 K/uL   Lymphocytes Relative 14 %   Lymphs Abs 0.8 0.7 - 4.0 K/uL   Monocytes Relative 8 %   Monocytes Absolute 0.5 0.1 - 1.0 K/uL   Eosinophils Relative 2 %   Eosinophils Absolute 0.1 0.0 - 0.5 K/uL   Basophils Relative 1 %   Basophils Absolute 0.0 0.0 - 0.1 K/uL   Immature Granulocytes 0 %  Abs Immature Granulocytes 0.01 0.00 - 0.07 K/uL    Comment: Performed at [facility], [place] [address] [place], [place] [address]  Magnesium     Status: None   Collection Time: 10/27/19  4:25 AM  Result Value Ref Range   Magnesium 1.9 1.7 - 2.4 mg/dL    Comment: Performed at [facility], [place] [address] [place], [place] [address]  SARS Coronavirus 2 by RT PCR (hospital order, performed in [facility] hospital lab) Nasopharyngeal Nasopharyngeal Swab     Status: None   Collection Time: 10/27/19  4:30 AM   Specimen: Nasopharyngeal Swab  Result  Value Ref Range   SARS Coronavirus 2 NEGATIVE NEGATIVE    Comment: (NOTE) SARS-CoV-2 target nucleic acids are NOT DETECTED. The SARS-CoV-2 RNA is generally detectable in upper and lower respiratory specimens during the acute phase of infection. The lowest concentration of SARS-CoV-2 viral copies this assay can detect is 250 copies / mL. A negative result does not preclude SARS-CoV-2 infection and should not be used as the sole basis for treatment or other patient management decisions.  A negative result may occur with improper specimen collection / handling, submission of specimen other than nasopharyngeal swab, presence of viral mutation(s) within the areas targeted by this assay, and inadequate number of viral copies (<250 copies / mL). A negative result must be combined with clinical observations, patient history, and epidemiological information. Fact Sheet for Patients:   StrictlyIdeas.no Fact Sheet for Healthcare Providers: BankingDealers.co.za This test is not yet approved or cleared  by the [place] FDA and has been authorized for detection and/or diagnosis of SARS-CoV-2 by FDA under an Emergency Use Authorization (EUA).  This EUA will remain in effect (meaning this test can be used) for the duration of the COVID-19 declaration under Section 564(b)(1) of the Act, 21 U.S.C. section 360bbb-3(b)(1), unless the authorization is terminated or revoked sooner. Performed at [facility], [place] [address], [place], [place] [address]   Urinalysis, Routine w reflex microscopic     Status: Abnormal   Collection Time: 10/27/19  5:37 AM  Result Value Ref Range   Color, Urine YELLOW YELLOW   APPearance CLEAR CLEAR   Specific Gravity, Urine 1.014 1.005 - 1.030   pH 5.0 5.0 - 8.0   Glucose, UA NEGATIVE NEGATIVE mg/dL   Hgb urine dipstick NEGATIVE NEGATIVE   Bilirubin Urine NEGATIVE NEGATIVE   Ketones, ur 5 (A) NEGATIVE  mg/dL   Protein, ur NEGATIVE NEGATIVE mg/dL   Nitrite NEGATIVE NEGATIVE   Leukocytes,Ua NEGATIVE NEGATIVE    Comment: Performed at [place] [address], [place], [place] [address]  HIV Antibody (routine testing w rflx)     Status: None   Collection Time: 10/27/19  7:33 AM  Result Value Ref Range   HIV Screen 4th Generation wRfx Non Reactive Non Reactive    Comment: Performed at [facility], [place] [address], [place], [place] [address]  Glucose, capillary     Status: None   Collection Time: 10/27/19 10:09 AM  Result Value Ref Range   Glucose-Capillary 98 70 - 99 mg/dL    Comment: Glucose reference range applies only to samples taken after fasting for at least 8 hours.   Comment 1 Notify RN    Comment 2 Document in Chart   MRSA PCR Screening     Status: None   Collection Time: 10/27/19 11:00 AM   Specimen: Nasal Mucosa; Nasopharyngeal  Result Value  Ref Range   MRSA by PCR NEGATIVE NEGATIVE    Comment:        The GeneXpert MRSA Assay (FDA approved for NASAL specimens only), is one component of a comprehensive MRSA colonization surveillance program. It is not intended to diagnose MRSA infection nor to guide or monitor treatment for MRSA infections. Performed at [facility], [place] [address], [place], [place] [address]   Glucose, capillary     Status: None   Collection Time: 10/27/19  3:49 PM  Result Value Ref Range   Glucose-Capillary 85 70 - 99 mg/dL    Comment: Glucose reference range applies only to samples taken after fasting for at least 8 hours.   Comment 1 Notify RN    Comment 2 Document in Chart   Glucose, capillary     Status: Abnormal   Collection Time: 10/27/19  7:37 PM  Result Value Ref Range   Glucose-Capillary 149 (H) 70 - 99 mg/dL    Comment: Glucose reference range applies only to samples taken after fasting for at least 8 hours.  Glucose, capillary     Status: None   Collection Time: 10/27/19 11:16 PM   Result Value Ref Range   Glucose-Capillary 87 70 - 99 mg/dL    Comment: Glucose reference range applies only to samples taken after fasting for at least 8 hours.  Triglycerides     Status: None   Collection Time: 10/28/19  2:44 AM  Result Value Ref Range   Triglycerides 37 <150 mg/dL    Comment: Performed at [facility], [place] [address] [place], [place] [address]  CBC     Status: None   Collection Time: 10/28/19  2:44 AM  Result Value Ref Range   WBC 5.9 4.0 - 10.5 K/uL   RBC 4.91 4.22 - 5.81 MIL/uL   Hemoglobin 13.1 13.0 - 17.0 g/dL   HCT 40.9 39.0 - 52.0 %   MCV 83.3 80.0 - 100.0 fL   MCH 26.7 26.0 - 34.0 pg   MCHC 32.0 30.0 - 36.0 g/dL   RDW 14.0 11.5 - 15.5 %   Platelets 210 150 - 400 K/uL   nRBC 0.0 0.0 - 0.2 %    Comment: Performed at [facility], [place] [address], [place], [place] [address]  Magnesium     Status: None   Collection Time: 10/28/19  2:44 AM  Result Value Ref Range   Magnesium 2.1 1.7 - 2.4 mg/dL    Comment: Performed at [facility], [place] [address] [place], [place] [address]  Phosphorus     Status: None   Collection Time: 10/28/19  2:44 AM  Result Value Ref Range   Phosphorus 3.1 2.5 - 4.6 mg/dL    Comment: Performed at [facility], [place] [address] [place], [place] [address]  Comprehensive metabolic panel     Status: Abnormal   Collection Time: 10/28/19  2:44 AM  Result Value Ref Range   Sodium 140 135 - 145 mmol/L   Potassium 3.5 3.5 - 5.1 mmol/L   Chloride 110 98 - 111 mmol/L   CO2 20 (L) 22 - 32 mmol/L   Glucose, Bld 93 70 - 99 mg/dL    Comment: Glucose reference range applies only to samples taken after fasting for at least 8 hours.   BUN 9 6 - 20 mg/dL   Creatinine, Ser 0.83 0.61 - 1.24 mg/dL   Calcium 8.6 (L) 8.9 - 10.3 mg/dL  Total Protein 6.2 (L) 6.5 - 8.1 g/dL   Albumin 3.8 3.5 - 5.0 g/dL   AST 15 15 - 41 U/L   ALT 13 0 - 44 U/L   Alkaline Phosphatase 50 38 -  126 U/L   Total Bilirubin 0.8 0.3 - 1.2 mg/dL   GFR calc non Af Amer >60 >60 mL/min   GFR calc Af Amer >60 >60 mL/min   Anion gap 10 5 - 15    Comment: Performed at [facility], [place] [address] [place], [place] [address]  Glucose, capillary     Status: None   Collection Time: 10/28/19  3:38 AM  Result Value Ref Range   Glucose-Capillary 91 70 - 99 mg/dL    Comment: Glucose reference range applies only to samples taken after fasting for at least 8 hours.  Glucose, capillary     Status: Abnormal   Collection Time: 10/28/19  7:50 AM  Result Value Ref Range   Glucose-Capillary 106 (H) 70 - 99 mg/dL    Comment: Glucose reference range applies only to samples taken after fasting for at least 8 hours.   Comment 1 Notify RN    Comment 2 Document in Chart     Current Facility-Administered Medications  Medication Dose Route Frequency Provider Last Rate Last Admin  . chlorhexidine gluconate (MEDLINE KIT) (PERIDEX) 0.12 % solution 15 mL  15 mL Mouth Rinse BID Chesley Mires, MD   15 mL at 10/28/19 0734  . Chlorhexidine Gluconate Cloth 2 % PADS 6 each  6 each Topical Q0600 Chesley Mires, MD   6 each at 10/28/19 0430  . dexmedetomidine (PRECEDEX) 200 MCG/50ML (4 mcg/mL) infusion  0.4-1.2 mcg/kg/hr Intravenous Titrated Ogan, [place]or[place]wo U, MD 9.18 mL/hr at 10/28/19 0700 0.6 mcg/kg/hr at 10/28/19 0700  . haloperidol lactate (HALDOL) injection 5 mg  5 mg Intravenous Q6H PRN Chesley Mires, MD      . heparin injection 5,000 Units  5,000 Units Subcutaneous Q8H Omar Person, NP   5,000 Units at 10/28/19 0600  . lactated ringers infusion   Intravenous Continuous Chesley Mires, MD 50 mL/hr at 10/28/19 0739 New Bag at 10/28/19 0739  . MEDLINE mouth rinse  15 mL Mouth Rinse 10 times per day Chesley Mires, MD   15 mL at 10/28/19 0600  . midazolam (VERSED) 2 MG/2ML injection             Musculoskeletal: Strength & Muscle Tone: within normal limits Gait & Station: Unable to assess Patient  leans: N/A  Psychiatric Specialty Exam: Physical Exam  Nursing note and vitals reviewed. Constitutional: He is oriented to person, place, and time. He appears well-developed.  HENT:  Head: Normocephalic.  Cardiovascular: Normal rate.  Respiratory: Effort normal.  Neurological: He is alert and oriented to person, place, and time.  Psychiatric: His speech is normal. His affect is labile. He is agitated and aggressive. Thought content is paranoid. He expresses impulsivity. He expresses suicidal ideation.    Review of Systems  Constitutional: Negative.   HENT: Negative.   Eyes: Negative.   Respiratory: Negative.   Cardiovascular: Negative.   Gastrointestinal: Negative.   Genitourinary: Negative.   Musculoskeletal: Negative.   Skin: Negative.   Neurological: Negative.   Psychiatric/Behavioral: Positive for suicidal ideas. The patient is nervous/anxious.     Blood pressure 105/63, pulse 67, temperature (!) 97 F (36.1 C), resp. rate 16, weight 61.8 kg, SpO2 100 %.Body mass index is 19.55 kg/m.  General Appearance: Casual and Fairly Groomed  Eye Contact:  Fair  Speech:  Clear and Coherent  Volume:  Normal  Mood:  Irritable  Affect:  Labile  Thought Process:  Coherent, Goal Directed and Descriptions of Associations: Intact  Orientation:  Full (Time, Place, and Person)  Thought Content:  Paranoid Ideation  Suicidal Thoughts:  No  Homicidal Thoughts:  No  Memory:  Immediate;   Good Recent;   Good Remote;   Good  Judgement:  Impaired  Insight:  Lacking  Psychomotor Activity:  Normal  Concentration:  Concentration: Fair and Attention Span: Fair  Recall:  [employer] of Knowledge:  Fair  Language:  Fair  Akathisia:  No  Handed:  Right  AIMS (if indicated):     Assets:  Communication Skills Desire for Improvement Financial Resources/Insurance Housing Intimacy Leisure Time Physical Health Resilience Social Support Talents/Skills  ADL's: Unable to assess  Cognition:   WNL  Sleep:        Treatment Plan Summary: Patient discussed Dr. Mallie Darting. Patient is a 19 year old male, presents after suspected intentional overdose. Patient is easily agitated. Based on my assessment today patient would benefit from inpatient psychiatric admission for stabilization after he is medically stable.  Recommendations: -Consider Haldol 10 mg IM or p.o. twice daily -Consider Cogentin 0.5 mg IM or p.o. twice daily -Consider social work consult to facilitate inpatient psychiatric admission -Consider placing patient on involuntary commitment if he refuses voluntary inpatient psychiatric admission   Disposition: Recommend psychiatric Inpatient admission when medically cleared. Supportive therapy provided about ongoing stressors. Discussed crisis plan, support from social network, calling 911, coming to the Emergency Department, and calling Suicide Hotline.   Please reconsult psychiatry as needed.  Emmaline Kluver, FNP 10/28/2019 9:26 AM

## 2019-10-28 NOTE — Progress Notes (Signed)
NAME:  Patrick [name], MRN:  [id number], DOB:  2000-11-20, LOS: 1 ADMISSION DATE:  10/27/2019, CONSULTATION DATE:  10/27/2019 REFERRING MD:  Dr. Nicanor Alcon, CHIEF COMPLAINT:  Overdose   History of present illness   19 year old male presents to [facility] ED on 5/26 with [employer] due to a drug overdose. When EMS arrived patient was lethargic and combative. Given 5 mg versed and 5 mg haldol on route to facility. Per report patient took unknown amounts of Trazodone, Zyprexa, Zoloft, Atarax in attempts to commit suicide. On arrival to ED patient is unresponsive, Bradypnea, tachycardiac with a GCS of 3. Intubated for airway protection. Head CT negative for acute. Critical Care asked to admit.   Past Medical History  Polysubstance Abuse, Depression  Significant Hospital Events   5/26 Presents to ED   Consults:    Procedures:  ETT 5/26 >> 5/27  Significant Diagnostic Tests:   CT Head 5/26 >> no acute intracranial process  CT C Spine 5/26 >> no fractures  EEG 5/26 >> slowing  Micro Data:  SARS CoV2 PCR 5/26 >> negative Sputum 5/26 >>  Antimicrobials:    Interim history/subjective:  Agitated this AM.  Objective   Blood pressure 105/63, pulse 67, temperature (!) 97 F (36.1 C), resp. rate 16, weight 61.8 kg, SpO2 100 %.    Vent Mode: PRVC FiO2 (%):  [30 %-100 %] 30 % Set Rate:  [16 bmp] 16 bmp Vt Set:  [580 mL] 580 mL PEEP:  [5 cmH20] 5 cmH20 Plateau Pressure:  [14 cmH20-15 cmH20] 15 cmH20   Intake/Output Summary (Last 24 hours) at 10/28/2019 0836 Last data filed at 10/28/2019 1287 Gross per 24 hour  Intake 2139.56 ml  Output 2675 ml  Net -535.44 ml   Filed Weights   10/28/19 0430  Weight: 61.8 kg    Examination:  General - alert Eyes - pupils reactive ENT - ETT in place Cardiac - regular rate/rhythm, no murmur Chest - equal breath sounds b/l, no wheezing or rales Abdomen - soft, non tender, + bowel sounds Extremities - no cyanosis, clubbing, or edema Skin - no rashes Neuro -  RASS +1, moves all extremities  Resolved Hospital Problem list     Assessment & Plan:   Acute metabolic encephalopathy in setting of intentional overdose. Possible seizure in setting of overdose. - UDS positive for THC, benzo's, amphetamines - consult psychiatry - sitter at bedside - prn haldol for agitation  Acute respiratory failure with hypoxia and compromised airway. - extubate 5/27   Best practice:  Diet: regular diet DVT prophylaxis: Heparin SQ GI prophylaxis: not indicated Mobility: Bedrest Code Status: full code Disposition: ICU  Labs:   CMP Latest Ref Rng & Units 10/28/2019 10/27/2019 04/08/2019  Glucose 70 - 99 mg/dL 93 867(E) 92  BUN 6 - 20 mg/dL 9 11 12   Creatinine 0.61 - 1.24 mg/dL 7.20 9.47  Sodium 135 - 145 mmol/L 140 137 138  Potassium 3.5 - 5.1 mmol/L 3.5 3.6 4.3  Chloride 98 - 111 mmol/L 110 105 104  CO2 22 - 32 mmol/L 20(L) 20(L) 26  Calcium 8.9 - 10.3 mg/dL 0.96) 2.8(Z) 9.9  Total Protein 6.5 - 8.1 g/dL 6.2(L) 7.1 7.3  Total Bilirubin 0.3 - 1.2 mg/dL 0.8 0.8 0.9  Alkaline Phos 38 - 126 U/L 50 61 67  AST 15 - 41 U/L 15 18 18   ALT 0 - 44 U/L 13 14 15     CBC Latest Ref Rng & Units 10/28/2019 10/27/2019 04/08/2019  WBC 4.0 - 10.5 K/uL 5.9 5.5 3.6(L)  Hemoglobin 13.0 - 17.0 g/dL 13.1 14.1 15.0  Hematocrit 39.0 - 52.0 % 40.9 42.7 46.1  Platelets 150 - 400 K/uL 210 200 250    CBG (last 3)  Recent Labs    10/27/19 2316 10/28/19 0338 10/28/19 0750  GLUCAP 87 91 106*    ABG    Component Value Date/Time   PHART 7.410 10/27/2019 0419   PCO2ART 34.2 10/27/2019 0419   PO2ART 322 (H) 10/27/2019 0419   HCO3 21.5 10/27/2019 0419   ACIDBASEDEF 2.3 (H) 10/27/2019 0419   O2SAT 99.2 10/27/2019 0419    Critical care time: 33 minutes   Chesley Mires, MD [place] Pager - [phone] 10/28/2019, 8:36 AM

## 2019-10-28 NOTE — Procedures (Signed)
Extubation Procedure Note  Patient Details:   Name: Patrick [name] DOB: May 18, 2001 MRN: [id number]   Airway Documentation:  Airway 7.5 mm (Active)  Secured at (cm) 24 cm 10/28/19 0801  Measured From Lips 10/28/19 0801  Secured Location Right 10/28/19 0801  Secured By [employer] 10/28/19 0801  Tube Holder Repositioned Yes 10/28/19 0801  Cuff Pressure (cm H2O) 28 cm H2O 10/27/19 1948  Site Condition Dry 10/28/19 0801   Vent end date: (not recorded) Vent end time: (not recorded)   Evaluation  O2 sats: stable throughout Complications: No apparent complications Patient did tolerate procedure well. Bilateral Breath Sounds: Clear, Diminished(faint scattered rhonchi)   Yes  Suzan Garibaldi 10/28/2019, 8:16 AM

## 2019-10-28 NOTE — Progress Notes (Signed)
eLink Physician-Brief Progress Note Patient Name: Patrick [name] DOB: [birth year] MRN: [id number]   Date of Service  10/28/2019  HPI/Events of Note  Pt became extremely agitated and tried to get out of bed, he required multiple nurses to restrain him.  eICU Interventions  Versed 3 mg iv x 1 given with resolution of acute agitation, will start patient on a Precedex infusion and wean propofol.        Thomasene Lot Blessing Zaucha 10/28/2019, 12:07 AM

## 2019-10-29 LAB — BASIC METABOLIC PANEL
Anion gap: 8 (ref 5–15)
BUN: 8 mg/dL (ref 6–20)
CO2: 23 mmol/L (ref 22–32)
Calcium: 8.6 mg/dL — ABNORMAL LOW (ref 8.9–10.3)
Chloride: 110 mmol/L (ref 98–111)
Creatinine, Ser: 0.87 mg/dL (ref 0.61–1.24)
GFR calc Af Amer: >60 mL/min (ref >60)
GFR calc non Af Amer: >60 mL/min (ref >60)
Glucose, Bld: 95 mg/dL (ref 70–99)
Potassium: 3.7 mmol/L (ref 3.5–5.1)
Sodium: 141 mmol/L (ref 135–145)

## 2019-10-29 LAB — GC/CHLAMYDIA PROBE AMP (~~LOC~~) NOT AT ARMC
Chlamydia: NEGATIVE
Comment: NEGATIVE
Comment: NORMAL
Neisseria Gonorrhea: NEGATIVE

## 2019-10-29 MED ORDER — BOOST / RESOURCE BREEZE PO LIQD CUSTOM
1.0000 | ORAL | Status: DC
  Administered 2019-10-30 – 2019-11-03 (×5): 1 via ORAL

## 2019-10-29 MED ORDER — CHLORHEXIDINE GLUCONATE CLOTH 2 % EX PADS
6.0000 | MEDICATED_PAD | Freq: Every day | CUTANEOUS | Status: DC
  Administered 2019-10-29 – 2019-10-31 (×2): 6 via TOPICAL

## 2019-10-29 MED ORDER — ACETAMINOPHEN 325 MG PO TABS
650.0000 mg | ORAL_TABLET | ORAL | Status: DC | PRN
  Administered 2019-10-29: 650 mg via ORAL
  Filled 2019-10-29: qty 2

## 2019-10-29 MED ORDER — ADULT MULTIVITAMIN W/MINERALS CH
1.0000 | ORAL_TABLET | Freq: Every day | ORAL | Status: DC
  Administered 2019-10-30 – 2019-10-31 (×2): 1 via ORAL
  Filled 2019-10-29 (×3): qty 1

## 2019-10-29 NOTE — Progress Notes (Signed)
eLink Physician-Brief Progress Note Patient Name: Patrick [name] DOB: [birth year] MRN: [id number]   Date of Service  10/29/2019  HPI/Events of Note  Pt c/o headache, he has normal LFT's and Tylenol was not one of his overdose drugs.  eICU Interventions  PRN Tylenol ordered for headache.        Patrick [name] 10/29/2019, 1:35 AM

## 2019-10-29 NOTE — Progress Notes (Signed)
RN called to the room and patient was found to have tongue rolled back along with neck stretched to one side, speech slurred but upper lip was not asymmetrical like earlier. RR paged. MD paged. New orders to discontinue haldol and cogentin were received. RN will continue to monitor the patient. Sitter to remain at bedside.

## 2019-10-29 NOTE — Progress Notes (Signed)
NAME:  Patrick [name], MRN:  [id number], DOB:  07-May-2001, LOS: 2 ADMISSION DATE:  10/27/2019, CONSULTATION DATE:  10/27/2019 REFERRING MD:  Dr. Nicanor Alcon, CHIEF COMPLAINT:  Overdose   History of present illness   19 year old male presents to [facility] ED on 5/26 with [employer] due to a drug overdose. When EMS arrived patient was lethargic and combative. Given 5 mg versed and 5 mg haldol on route to facility. Per report patient took unknown amounts of Trazodone, Zyprexa, Zoloft, Atarax in attempts to commit suicide. On arrival to ED patient is unresponsive, Bradypnea, tachycardiac with a GCS of 3. Intubated for airway protection. Head CT negative for acute. Critical Care asked to admit.   Past Medical History  Polysubstance Abuse, Depression  Significant Hospital Events   5/26 Presents to ED  5/27 Extubated, IVC paperwork completed  Consults:    Procedures:  ETT 5/26 >> 5/27  Significant Diagnostic Tests:   CT Head 5/26 >> no acute intracranial process  CT C Spine 5/26 >> no fractures  EEG 5/26 >> slowing  Micro Data:  SARS CoV2 PCR 5/26 >> negative Sputum 5/26 >>  Antimicrobials:    Interim history/subjective:  Pt asking if he can talk to PSY as he felt his interview "did not go well and he wants to go home".  He specifically asked if he had "been IVC'd"   Afebrile.   Waiting on breakfast   Objective   Blood pressure 130/83, pulse 80, temperature 98.2 F (36.8 C), temperature source Oral, resp. rate 15, height 5\' 10"  (1.778 m), weight 61.8 kg, SpO2 99 %.        Intake/Output Summary (Last 24 hours) at 10/29/2019 0925 Last data filed at 10/29/2019 0600 Gross per 24 hour  Intake 1434.53 ml  Output --  Net 1434.53 ml   Filed Weights   10/28/19 0430 10/29/19 0420  Weight: 61.8 kg 61.8 kg    Examination: General: adult male lying in bed in NAD HEENT: MM pink/moist, no jvd, good dentition  Neuro: AAOx4, speech clear, AME CV: s1s2 RRR, no m/r/g PULM: non-labored on RA, lungs  bilaterally clear  GI: soft, bsx4 active  Extremities: warm/dry, no edema  Skin: no rashes or lesions  Resolved Hospital Problem list   Acute respiratory failure with hypoxia and compromised airway.  Assessment & Plan:   Acute metabolic encephalopathy in setting of intentional overdose. Possible seizure in setting of overdose. UDS positive for THC, benzo's, amphetamines -appreciate psychiatry input, have reached out at patients request for revisit.   -continue suicide sitter  -PRN haldol for agitation  -medically ready for transfer to [facility] when bed available    Best practice:  Diet: regular diet DVT prophylaxis: Heparin SQ GI prophylaxis: not indicated Mobility: Bedrest Code Status: full code Disposition: ICU  Labs:   CMP Latest Ref Rng & Units 10/29/2019 10/28/2019 10/27/2019  Glucose 70 - 99 mg/dL 95 93 10/29/2019)  BUN 6 - 20 mg/dL 8 9 11   Creatinine 0.61 - 1.24 mg/dL 818(E 9.93  Sodium 135 - 145 mmol/L 141 140 137  Potassium 3.5 - 5.1 mmol/L 3.7 3.5 3.6  Chloride 98 - 111 mmol/L 110 110 105  CO2 22 - 32 mmol/L 23 20(L) 20(L)  Calcium 8.9 - 10.3 mg/dL 7.16) 9.67) 8.9(F)  Total Protein 6.5 - 8.1 g/dL - 6.2(L) 7.1  Total Bilirubin 0.3 - 1.2 mg/dL - 0.8 0.8  Alkaline Phos 38 - 126 U/L - 50 61  AST 15 - 41 U/L -  15 18  ALT 0 - 44 U/L - 13 14    CBC Latest Ref Rng & Units 10/28/2019 10/27/2019 04/08/2019  WBC 4.0 - 10.5 K/uL 5.9 5.5 3.6(L)  Hemoglobin 13.0 - 17.0 g/dL 13.1 14.1 15.0  Hematocrit 39.0 - 52.0 % 40.9 42.7 46.1  Platelets 150 - 400 K/uL 210 200 250    CBG (last 3)  Recent Labs    10/28/19 0338 10/28/19 0750 10/28/19 1229  GLUCAP 91 106* 87    ABG    Component Value Date/Time   PHART 7.410 10/27/2019 0419   PCO2ART 34.2 10/27/2019 0419   PO2ART 322 (H) 10/27/2019 0419   HCO3 21.5 10/27/2019 0419   ACIDBASEDEF 2.3 (H) 10/27/2019 0419   O2SAT 99.2 10/27/2019 0419    Critical care time:      Noe Gens, MSN, NP-C [facility] 10/29/2019, 9:31 AM   Please see [email or web address] for pager details.

## 2019-10-29 NOTE — Progress Notes (Signed)
Nutrition Follow-up  DOCUMENTATION CODES:   Not applicable  INTERVENTION:  - will order Boost Breeze once/day, each supplement provides 250 kcal and 9 grams of protein. - will order Magic Cup with dinner meals, each supplement provides 290 kcal and 9 grams of protein. - will order 1 tablet multivitamin with minerals.   NUTRITION DIAGNOSIS:   Increased nutrient needs related to acute illness as evidenced by estimated needs. -revised  GOAL:   Patient will meet greater than or equal to 90% of their needs -progressing  MONITOR:   PO intake, Supplement acceptance, Labs, Weight trends  ASSESSMENT:   19 year old male who present to the ED on 5/26 via [employer] d/t drug OD. He was lethargic and combative when EMS arrived. He had taken unknown amounts of trazadone, Zyprexa, Zoloft, and atarx in a suicide attempt. He was intubated for airway protection.  Patient was extubated 5/27 at 0815 and OGT removed at that time. Re-estimated nutrition needs. Diet advanced from NPO to Regular yesterday at 1100 and he ate 100% of lunch yesterday and 100% of breakfast this AM.   Patient was admitted following an intentional OD in a suicide attempt. Plan is for [facility] admission following discharge from [place].   Labs reviewed; CBGs: 91, 106, 87 mg/dl, Ca: 8.6 mg/dl. Medications reviewed.   NUTRITION - FOCUSED PHYSICAL EXAM:  unable to complete at this time as patient is being transferred from 2W to 5E  Diet Order:   Diet Order            Diet regular Room service appropriate? Yes; Fluid consistency: Thin  Diet effective now              EDUCATION NEEDS:   No education needs have been identified at this time  Skin:  Skin Assessment: Reviewed RN Assessment  Last BM:  5/25  Height:   Ht Readings from Last 1 Encounters:  10/28/19 5\' 10"  (1.778 m)    Weight:   Wt Readings from Last 1 Encounters:  10/29/19 61.8 kg    Estimated Nutritional Needs:  Kcal:  1900-2100 kcal Protein:  90-100  grams Fluid:  >/= 2 L/day     10/31/19, MS, RD, LDN, CNSC Inpatient Clinical Dietitian RD pager # available in AMION  After hours/weekend pager # available in [facility]

## 2019-10-29 NOTE — Progress Notes (Signed)
Spoke with Psychiatry at patients request.  Psychiatry feels patient is appropriate for inpatient behavioral health admission.  Will continue supportive care until bed available at [facility].     Canary Brim, MSN, NP-C [facility] 10/29/2019, 2:49 PM   Please see [email or web address] for pager details.

## 2019-10-29 NOTE — Progress Notes (Signed)
Notified by bedside RN, Norma Fredrickson, in regards to patient having extrapyramidal symptoms. Upon arrival on the first call, symptoms had resolved. Upon arrival to the second call, symptoms did occur. Patient did have twisting movements with his tongue and tension with his neck. When patient's tongue was twisted, patient did have noticeable slurring of words. Patient did not have facial asymmetry. Patient was able to follow commands with ease. Patient had normal gait upon walking in room. Notified MD Sood in regards to symptoms. MD Sood previously discontinued haldol and congentin. MD Sood ordered to monitor patient for now, symptoms should clear up within 24hrs. If patient has any new neurological symptoms or changes in mentation, please call Rapid Response at [phone].

## 2019-10-29 NOTE — Progress Notes (Signed)
Possible reaction to haldol or cogentin with abnormal tongue movement and stuttering speech.  Will d/c these medications and monitor.  Coralyn Helling, MD [facility]monary/Critical Care Pager - [phone] 10/29/2019, 5:52 PM

## 2019-10-29 NOTE — TOC Progression Note (Addendum)
Transition of Care [facility]) - Progression Note    Patient Details  Name: Patrick [name] MRN: [id number] Date of Birth: 10-18-2000  Transition of Care [facility]) CM/SW Contact  [name], [place] Phone Number: 10/29/2019, 3:08 PM  Clinical Narrative:   Patient who is IVC'd is recommended for inpatient behavioral health hospitalization by psychiatry.  Today's MD note indicates patient is asking to be seen again by psych., insisting that he is not a danger to self nor others. Will refer patient to [facility] as status is currently unchanged.  TOC will continue to follow during the course of hospitalization.  Addendum:  Left a message at [facility] with referral.  Made referral to [facility] at [facility].   Addendum IIJerilynn Som at [facility] declined patient.    Expected Discharge Plan: Psychiatric Hospital Barriers to Discharge: Continued Medical Work up  Expected Discharge Plan and Services Expected Discharge Plan: Psychiatric Hospital   Discharge Planning Services: CM Consult   Living arrangements for the past 2 months: Single Family Home                                       Social Determinants of Health (SDOH) Interventions    Readmission Risk Interventions No flowsheet data found.

## 2019-10-29 NOTE — Progress Notes (Signed)
RN called to the patient due to patient slurring and having extrapyramidal symptoms with his upper lip. RR response called. MD paged. Bedside stroke scale done, handgrips and pushes, smile were all symmetrical.  Prior to RR arrival patient was back to baseline. RN advised to call RR back if it continues. Sitter remains with patient.

## 2019-10-30 DIAGNOSIS — G2402 Drug induced acute dystonia: Secondary | ICD-10-CM

## 2019-10-30 DIAGNOSIS — T50902D Poisoning by unspecified drugs, medicaments and biological substances, intentional self-harm, subsequent encounter: Secondary | ICD-10-CM

## 2019-10-30 MED ORDER — LORAZEPAM 1 MG PO TABS
1.0000 mg | ORAL_TABLET | Freq: Three times a day (TID) | ORAL | Status: AC
  Administered 2019-10-30 – 2019-10-31 (×6): 1 mg via ORAL
  Filled 2019-10-30 (×6): qty 1

## 2019-10-30 MED ORDER — DIPHENHYDRAMINE HCL 25 MG PO CAPS
25.0000 mg | ORAL_CAPSULE | Freq: Two times a day (BID) | ORAL | Status: DC
  Administered 2019-10-30 – 2019-11-02 (×8): 25 mg via ORAL
  Filled 2019-10-30 (×8): qty 1

## 2019-10-30 NOTE — Progress Notes (Signed)
PROGRESS NOTE    Patrick [name]  RWE:[id number] DOB: 30-Sep-2000 DOA: 10/27/2019 PCP: Maude Leriche, PA-C   Brief Narrative: Patrick [name] is a 19 y.o. male with a history of polysubstance abuse and depression presented secondary to intentional drug overdose requiring intubation and mechanical ventilation. Patient has now improved and is awaiting behavioral health admission.   Assessment & Plan:   Principal Problem:   Intentional drug overdose ([place]) Active Problems:   Acute hypoxemic respiratory failure (HCC)   Intentional drug overdose Patient took unknown amounts of Trazodone, Zyprexa, Zoloft, Atarax with the intention of committing suicide. Psychiatry consulted and is recommending inpatient behavioral health admission. -Psychiatry recommendations for medications: pending today  Acute respiratory failure with hypoxemia Secondary to above. Required intubation for airway protection and mechanical ventilation from 5/26 until 5/27. Currently on room air. Resolved.  Acute metabolic encephalopathy Secondary to drug overdose. Resolved.  Possibly seizure In setting of overdose. No recurrent seizures noted.  Dystonia Extrapyramidal symptoms Likely secondary to haldol which was discontinued. Symptoms improved. Cogentin also discontinued. -Psychiatry recommendations   DVT prophylaxis: Heparin subq Code Status:   Code Status: Full Code Family Communication: None at bedside Disposition Plan: Discharge to [facility] when bed is available. Medically stable for discharge.   Consultants:   Psychiatry  PCCM  Procedures:   Intubation  Antimicrobials:  None    Subjective: No issues today. Hoping to go home rather than inpatient behavioral health  Objective: Vitals:   10/29/19 1220 10/29/19 1758 10/29/19 2129 10/30/19 0641  BP: 115/73 121/70 129/71 120/81  Pulse: 73 68 (!) 102 83  Resp: 20 16 17 18   Temp: 98.6 F (37 C) 98.2 F (36.8 C) 98.4 F (36.9 C) 98.9 F (37.2 C)   TempSrc: Oral Oral Oral Oral  SpO2: 100% 100% 100% 99%  Weight:      Height:        Intake/Output Summary (Last 24 hours) at 10/30/2019 1128 Last data filed at 10/30/2019 0840 Gross per 24 hour  Intake 1680 ml  Output -  Net 1680 ml   Filed Weights   10/28/19 0430 10/29/19 0420  Weight: 61.8 kg 61.8 kg    Examination:  General exam: Appears calm and comfortable Respiratory system: Clear to auscultation. Respiratory effort normal. Cardiovascular system: S1 & S2 heard, RRR. No murmurs, rubs, gallops or clicks. Gastrointestinal system: Abdomen is nondistended, soft and nontender. No organomegaly or masses felt. Normal bowel sounds heard. Central nervous system: Alert and oriented. No focal neurological deficits. Musculoskeletal: No edema. No calf tenderness Skin: No cyanosis. No rashes Psychiatry: Judgement and insight appear normal. Mood & affect appropriate.     Data Reviewed: I have personally reviewed following labs and imaging studies  CBC Lab Results  Component Value Date   WBC 5.9 10/28/2019   RBC 4.91 10/28/2019   HGB 13.1 10/28/2019   HCT 40.9 10/28/2019   MCV 83.3 10/28/2019   MCH 26.7 10/28/2019   PLT 210 10/28/2019   MCHC 32.0 10/28/2019   RDW 14.0 10/28/2019   LYMPHSABS 0.8 10/27/2019   MONOABS 0.5 10/27/2019   EOSABS 0.1 10/27/2019   BASOSABS 0.0 61/95/0932     Last metabolic panel Lab Results  Component Value Date   NA 141 10/29/2019   K 3.7 10/29/2019   CL 110 10/29/2019   CO2 23 10/29/2019   BUN 8 10/29/2019   CREATININE 0.87 10/29/2019   GLUCOSE 95 10/29/2019   GFRNONAA >60 10/29/2019   GFRAA >60 10/29/2019   CALCIUM 8.6 (  L) 10/29/2019   PHOS 3.1 10/28/2019   PROT 6.2 (L) 10/28/2019   ALBUMIN 3.8 10/28/2019   BILITOT 0.8 10/28/2019   ALKPHOS 50 10/28/2019   AST 15 10/28/2019   ALT 13 10/28/2019   ANIONGAP 8 10/29/2019    CBG (last 3)  Recent Labs    10/28/19 0338 10/28/19 0750 10/28/19 1229  GLUCAP 91 106* 87     GFR:  Estimated Creatinine Clearance: 117.4 mL/min (by C-G formula based on SCr of 0.87 mg/dL).  Coagulation Profile: No results for input(s): INR, PROTIME in the last 168 hours.  Recent Results (from the past 240 hour(s))  SARS Coronavirus 2 by RT PCR (hospital order, performed in [facility] hospital lab) Nasopharyngeal Nasopharyngeal Swab     Status: None   Collection Time: 10/27/19  4:30 AM   Specimen: Nasopharyngeal Swab  Result Value Ref Range Status   SARS Coronavirus 2 NEGATIVE NEGATIVE Final    Comment: (NOTE) SARS-CoV-2 target nucleic acids are NOT DETECTED. The SARS-CoV-2 RNA is generally detectable in upper and lower respiratory specimens during the acute phase of infection. The lowest concentration of SARS-CoV-2 viral copies this assay can detect is 250 copies / mL. A negative result does not preclude SARS-CoV-2 infection and should not be used as the sole basis for treatment or other patient management decisions.  A negative result may occur with improper specimen collection / handling, submission of specimen other than nasopharyngeal swab, presence of viral mutation(s) within the areas targeted by this assay, and inadequate number of viral copies (<250 copies / mL). A negative result must be combined with clinical observations, patient history, and epidemiological information. Fact Sheet for Patients:   BoilerBrush.com.cy Fact Sheet for Healthcare Providers: https://pope.com/ This test is not yet approved or cleared  by the [place] FDA and has been authorized for detection and/or diagnosis of SARS-CoV-2 by FDA under an Emergency Use Authorization (EUA).  This EUA will remain in effect (meaning this test can be used) for the duration of the COVID-19 declaration under Section 564(b)(1) of the Act, 21 U.S.C. section 360bbb-3(b)(1), unless the authorization is terminated or revoked sooner. Performed at [facility], [address] [place], [place] [address]   MRSA PCR Screening     Status: None   Collection Time: 10/27/19 11:00 AM   Specimen: Nasal Mucosa; Nasopharyngeal  Result Value Ref Range Status   MRSA by PCR NEGATIVE NEGATIVE Final    Comment:        The GeneXpert MRSA Assay (FDA approved for NASAL specimens only), is one component of a comprehensive MRSA colonization surveillance program. It is not intended to diagnose MRSA infection nor to guide or monitor treatment for MRSA infections. Performed at [facility], [address] [place], [place] [address]         Radiology Studies: No results found.      Scheduled Meds: . Chlorhexidine Gluconate Cloth  6 each Topical Q0600  . diphenhydrAMINE  25 mg Oral BID  . feeding supplement  1 Container Oral Q24H  . heparin  5,000 Units Subcutaneous Q8H  . LORazepam  1 mg Oral TID  . multivitamin with minerals  1 tablet Oral Daily   Continuous Infusions:   LOS: 3 days     Jacquelin Hawking, MD [facility] 10/30/2019, 11:28 AM  If 7PM-7AM, please contact night-coverage [email or web address]

## 2019-10-30 NOTE — Plan of Care (Signed)
  Problem: Education: Goal: Knowledge of General Education information will improve Description: Including pain rating scale, medication(s)/side effects and non-pharmacologic comfort measures Outcome: Progressing   Problem: Health Behavior/Discharge Planning: Goal: Ability to manage health-related needs will improve Outcome: Progressing   Problem: Clinical Measurements: Goal: Ability to maintain clinical measurements within normal limits will improve Outcome: Progressing Goal: Will remain free from infection Outcome: Progressing Goal: Diagnostic test results will improve Outcome: Progressing Goal: Respiratory complications will improve Outcome: Progressing Goal: Cardiovascular complication will be avoided Outcome: Progressing   Problem: Safety: Goal: Ability to remain free from injury will improve Outcome: Progressing   Problem: Pain Managment: Goal: General experience of comfort will improve Outcome: Progressing   Problem: Coping: Goal: Level of anxiety will decrease Outcome: Progressing

## 2019-10-30 NOTE — Consult Note (Signed)
[facility] Face-to-Face Psychiatry Consult   Reason for Consult: "Awaiting inpatient psych placement but patient experienced acute extrapyramidal symptoms.''  Referring Physician:  Caleb Popp Ralph,MD Patient Identification: Patrick [name] MRN:  [id number] Principal Diagnosis: Intentional drug overdose (HCC) Diagnosis:  Principal Problem:   Intentional drug overdose (HCC) Active Problems:   Acute hypoxemic respiratory failure (HCC)   Total Time spent with patient: 30 Minutes  Subjective: "I am stressed out, had a lot going but I overdosed because I was overwhelmed.''  Objective: Patient is assessed today, chart reviewed and treatment plan discussed with the team. Patient with history of depression, DMDD, polysubstance abuse, multiple legal issue who was admitted after he intentionally overdosed on multiple drugs(positive for Amphetamine, Benzo and THC). Patient reports long history of mental illness but very poor compliance with treatment. He reports multiple felonies due to selling drugs, toting guns for which he is currently on probation till 2023. Patient reports that he has been trying to change his life from bad to good but he is not getting good quick enough. Patient is irritable, easily agitated, frustrated and unable to contract for safety. Today, he complaint of spasms/stiff neck and his appears to be bending towards his left side  Past Psychiatric History: Disruptive mood dysregulation disorder, depressive disorder, cannabis use disorder, intermittent explosive disorder, major depressive disorder  Risk to Self:  Denies currently-questionable recent attempt Risk to Others:  Denies Prior Inpatient Therapy:  [facility] November 2020 Prior Outpatient Therapy:  Denies  Past Medical History:  Past Medical History:  Diagnosis Date  . Anxiety   . Depressive disorder 10/10/2015    Past Surgical History:  Procedure Laterality Date  . HERNIA REPAIR     Family History:  Family  History  Problem Relation Age of Onset  . Heart failure Maternal Grandmother   . Atrial fibrillation Maternal Grandmother   . Heart attack Other        maternal uncle had MI at age 31   Family Psychiatric  History: None reported Social History:  Social History   Substance and Sexual Activity  Alcohol Use Yes     Social History   Substance and Sexual Activity  Drug Use Yes  . Types: Marijuana    Social History   Socioeconomic History  . Marital status: Single    Spouse name: Not on file  . Number of children: Not on file  . Years of education: Not on file  . Highest education level: Not on file  Occupational History  . Not on file  Tobacco Use  . Smoking status: Current Some Day Smoker    Types: Cigars  . Smokeless tobacco: Never Used  Substance and Sexual Activity  . Alcohol use: Yes  . Drug use: Yes    Types: Marijuana  . Sexual activity: Yes    Birth control/protection: None  Other Topics Concern  . Not on file  Social History Narrative   ** Merged History Encounter **       Social Determinants of Health   Financial Resource Strain:   . Difficulty of Paying Living Expenses:   Food Insecurity:   . Worried About Programme researcher, broadcasting/film/video in the Last Year:   . Barista in the Last Year:   Transportation Needs:   . Freight forwarder (Medical):   Marland Kitchen Lack of Transportation (Non-Medical):   Physical Activity:   . Days of Exercise per Week:   . Minutes of Exercise per Session:   Stress:   .  Feeling of Stress :   Social Connections:   . Frequency of Communication with Friends and Family:   . Frequency of Social Gatherings with Friends and Family:   . Attends Religious Services:   . Active Member of Clubs or Organizations:   . Attends Banker Meetings:   Marland Kitchen Marital Status:    Additional Social History:    Allergies:   Allergies  Allergen Reactions  . Bee Venom Anaphylaxis    unknown    Labs:  Results for orders placed or  performed during the hospital encounter of 10/27/19 (from the past 48 hour(s))  Basic metabolic panel     Status: Abnormal   Collection Time: 10/29/19  6:28 AM  Result Value Ref Range   Sodium 141 135 - 145 mmol/L   Potassium 3.7 3.5 - 5.1 mmol/L   Chloride 110 98 - 111 mmol/L   CO2 23 22 - 32 mmol/L   Glucose, Bld 95 70 - 99 mg/dL    Comment: Glucose reference range applies only to samples taken after fasting for at least 8 hours.   BUN 8 6 - 20 mg/dL   Creatinine, Ser 6.97 0.61 - 1.24 mg/dL   Calcium 8.6 (L) 8.9 - 10.3 mg/dL   GFR calc non Af Amer >60 >60 mL/min   GFR calc Af Amer >60 >60 mL/min   Anion gap 8 5 - 15    Comment: Performed at [facility], [address], [place], [place] [address]    Current Facility-Administered Medications  Medication Dose Route Frequency Provider Last Rate Last Admin  . acetaminophen (TYLENOL) tablet 650 mg  650 mg Oral Q4H PRN Migdalia Dk, MD   650 mg at 10/29/19 0138  . Chlorhexidine Gluconate Cloth 2 % PADS 6 each  6 each Topical Q0600 Briant Cedar, MD   6 each at 10/29/19 1150  . diphenhydrAMINE (BENADRYL) capsule 25 mg  25 mg Oral BID Verneta Hamidi, MD   25 mg at 10/30/19 1330  . feeding supplement (BOOST / RESOURCE BREEZE) liquid 1 Container  1 Container Oral Q24H Coralyn Helling, MD   1 Container at 10/30/19 1017  . heparin injection 5,000 Units  5,000 Units Subcutaneous Q8H Tobey Grim, NP   5,000 Units at 10/28/19 1330  . LORazepam (ATIVAN) tablet 1 mg  1 mg Oral TID Thedore Mins, MD   1 mg at 10/30/19 1330  . multivitamin with minerals tablet 1 tablet  1 tablet Oral Daily Coralyn Helling, MD   1 tablet at 10/30/19 1017    Musculoskeletal: Strength & Muscle Tone: within normal limits Gait & Station: normal Patient leans: N/A  Psychiatric Specialty Exam: Physical Exam  Nursing note and vitals reviewed. Constitutional: He is oriented to person, place, and time. He appears well-developed.   HENT:  Head: Normocephalic.  Cardiovascular: Normal rate.  Respiratory: Effort normal.  Neurological: He is alert and oriented to person, place, and time.  Psychiatric: His speech is normal. His affect is labile. He is agitated and aggressive. Thought content is paranoid. Cognition and memory are normal. He expresses impulsivity. He expresses suicidal ideation.    Review of Systems  Constitutional: Negative.   HENT: Negative.   Eyes: Negative.   Respiratory: Negative.   Cardiovascular: Negative.   Gastrointestinal: Negative.   Genitourinary: Negative.   Musculoskeletal: Negative.   Skin: Negative.   Neurological: Negative.   Psychiatric/Behavioral: Positive for behavioral problems and suicidal ideas. The patient is nervous/anxious.  Blood pressure 122/72, pulse 87, temperature 98.3 F (36.8 C), temperature source Oral, resp. rate 16, height 5\' 10"  (1.778 m), weight 61.8 kg, SpO2 98 %.Body mass index is 19.55 kg/m.  General Appearance: Fairly Groomed  Eye Contact:  Good  Speech:  Clear and Coherent  Volume:  Normal  Mood:  Irritable  Affect:  Labile  Thought Process:  Coherent and Goal Directed  Orientation:  Full (Time, Place, and Person)  Thought Content:  Delusions  Suicidal Thoughts:  Yes.  without intent/plan  Homicidal Thoughts:  No  Memory:  Immediate;   Good Recent;   Good Remote;   Good  Judgement:  Impaired  Insight:  Lacking  Psychomotor Activity:  Normal  Concentration:  Concentration: Fair and Attention Span: Fair  Recall:  [employer] of Knowledge:  Fair  Language:  Fair  Akathisia:  No  Handed:  Right  AIMS (if indicated):     Assets:  Communication Skills Desire for Improvement  ADL's: Unable to assess  Cognition:  WNL  Sleep:        Treatment Plan Summary:  Patient is a 19 year old male with long history of mental illness and polysubstance abuse who was admitted after he intentionally overdosed in order to end his life. Patient is still  irritable, easily agitated and will benefit from inpatient psychiatric admission for stabilization.  Recommendations: -Discontinue  Haldol 10 mg IM or p.o. twice daily due to Acute Dystonia -Consider Lorazepam 1 mg TID for agitation/acute dystonia -Consider Benadryl 25 mg twice daily for acute dystonia. Consider  social work consult to facilitate inpatient psychiatric admission -Consider placing patient on involuntary commitment if he refuses voluntary inpatient psychiatric admission   Disposition: Recommend psychiatric Inpatient admission when medically cleared. Supportive therapy provided about ongoing stressors.   Please reconsult psychiatry as needed.  Corena Pilgrim, MD 10/30/2019 2:18 PM

## 2019-10-30 NOTE — Progress Notes (Signed)
Patient slept well overnight. Refused heparin injections overnight. No other complaints or distress noted.

## 2019-10-31 NOTE — TOC Progression Note (Signed)
Transition of Care [facility]) - Progression Note    Patient Details  Name: Patrick [name] MRN: [id number] Date of Birth: 07-Jul-2000  Transition [facility]) CM/SW Contact  [name], [place] Phone Number: 10/31/2019, 10:42 AM  Clinical Narrative:   Patient referred to Yvetta Coder, [facility] for psych placement.  Attempted to call [facility], no answer. TOC will continue to follow during the course of hospitalization.     Expected Discharge Plan: Psychiatric Hospital Barriers to Discharge: Continued Medical Work up  Expected Discharge Plan and Services Expected Discharge Plan: Psychiatric Hospital   Discharge Planning Services: CM Consult   Living arrangements for the past 2 months: Single Family Home                                       Social Determinants of Health (SDOH) Interventions    Readmission Risk Interventions No flowsheet data found.

## 2019-10-31 NOTE — Plan of Care (Signed)

## 2019-10-31 NOTE — Progress Notes (Signed)
PROGRESS NOTE    Patrick [name]  QIH:[id number] DOB: 2000/09/07 DOA: 10/27/2019 PCP: Clementeen Graham, PA-C   Brief Narrative: Patrick [name] is a 19 y.o. male with a history of polysubstance abuse and depression presented secondary to intentional drug overdose requiring intubation and mechanical ventilation. Patient has now improved and is awaiting behavioral health admission.   Assessment & Plan:   Principal Problem:   Intentional drug overdose (HCC) Active Problems:   Acute hypoxemic respiratory failure (HCC)   Intentional drug overdose Patient took unknown amounts of Trazodone, Zyprexa, Zoloft, Atarax with the intention of committing suicide. Psychiatry consulted and is recommending inpatient behavioral health admission. -Psychiatry recommendations for medications: Inpatient behavioral health admission  Acute respiratory failure with hypoxemia Secondary to above. Required intubation for airway protection and mechanical ventilation from 5/26 until 5/27. Currently on room air. Resolved.  Acute metabolic encephalopathy Secondary to drug overdose. Resolved.  Possibly seizure In setting of overdose. No recurrent seizures noted.  Dystonia Extrapyramidal symptoms Likely secondary to haldol which was discontinued. Symptoms improved. Cogentin also discontinued. -Psychiatry recommendations: Ativan scheduled x48 hours   DVT prophylaxis: Heparin subq Code Status:   Code Status: Full Code Family Communication: None at bedside Disposition Plan: Discharge to [facility] when bed is available. Medically stable for discharge.   Consultants:   Psychiatry  PCCM  Procedures:   Intubation  Antimicrobials:  None    Subjective: No issues today. Hoping to go home rather than inpatient behavioral health  Objective: Vitals:   10/30/19 1415 10/30/19 2152 10/31/19 0500 10/31/19 0606  BP: 122/72 113/80  (!) 105/59  Pulse: 87 80  74  Resp: 16 18  20   Temp: 98.3 F (36.8 C)   98.8 F  (37.1 C)  TempSrc: Oral   Oral  SpO2: 98% 99%  100%  Weight:   60.8 kg   Height:        Intake/Output Summary (Last 24 hours) at 10/31/2019 1150 Last data filed at 10/31/2019 1040 Gross per 24 hour  Intake 1080 ml  Output --  Net 1080 ml   Filed Weights   10/28/19 0430 10/29/19 0420 10/31/19 0500  Weight: 61.8 kg 61.8 kg 60.8 kg    Examination:  General exam: Appears calm and comfortable Respiratory system: Clear to auscultation. Respiratory effort normal. Cardiovascular system: S1 & S2 heard, RRR. No murmurs, rubs, gallops or clicks. Gastrointestinal system: Abdomen is nondistended, soft and nontender. No organomegaly or masses felt. Normal bowel sounds heard. Central nervous system: Alert and oriented. No focal neurological deficits. Musculoskeletal: No edema. No calf tenderness Skin: No cyanosis. No rashes Psychiatry: Judgement and insight appear normal. Mood & affect appropriate.     Data Reviewed: I have personally reviewed following labs and imaging studies  CBC Lab Results  Component Value Date   WBC 5.9 10/28/2019   RBC 4.91 10/28/2019   HGB 13.1 10/28/2019   HCT 40.9 10/28/2019   MCV 83.3 10/28/2019   MCH 26.7 10/28/2019   PLT 210 10/28/2019   MCHC 32.0 10/28/2019   RDW 14.0 10/28/2019   LYMPHSABS 0.8 10/27/2019   MONOABS 0.5 10/27/2019   EOSABS 0.1 10/27/2019   BASOSABS 0.0 10/27/2019     Last metabolic panel Lab Results  Component Value Date   NA 141 10/29/2019   K 3.7 10/29/2019   CL 110 10/29/2019   CO2 23 10/29/2019   BUN 8 10/29/2019   CREATININE 0.87 10/29/2019   GLUCOSE 95 10/29/2019   GFRNONAA >60 10/29/2019   GFRAA >60  10/29/2019   CALCIUM 8.6 (L) 10/29/2019   PHOS 3.1 10/28/2019   PROT 6.2 (L) 10/28/2019   ALBUMIN 3.8 10/28/2019   BILITOT 0.8 10/28/2019   ALKPHOS 50 10/28/2019   AST 15 10/28/2019   ALT 13 10/28/2019   ANIONGAP 8 10/29/2019    CBG (last 3)  Recent Labs    10/28/19 1229  GLUCAP 87     GFR: Estimated  Creatinine Clearance: 115.5 mL/min (by C-G formula based on SCr of 0.87 mg/dL).  Coagulation Profile: No results for input(s): INR, PROTIME in the last 168 hours.  Recent Results (from the past 240 hour(s))  SARS Coronavirus 2 by RT PCR (hospital order, performed in [facility] hospital lab) Nasopharyngeal Nasopharyngeal Swab     Status: None   Collection Time: 10/27/19  4:30 AM   Specimen: Nasopharyngeal Swab  Result Value Ref Range Status   SARS Coronavirus 2 NEGATIVE NEGATIVE Final    Comment: (NOTE) SARS-CoV-2 target nucleic acids are NOT DETECTED. The SARS-CoV-2 RNA is generally detectable in upper and lower respiratory specimens during the acute phase of infection. The lowest concentration of SARS-CoV-2 viral copies this assay can detect is 250 copies / mL. A negative result does not preclude SARS-CoV-2 infection and should not be used as the sole basis for treatment or other patient management decisions.  A negative result may occur with improper specimen collection / handling, submission of specimen other than nasopharyngeal swab, presence of viral mutation(s) within the areas targeted by this assay, and inadequate number of viral copies (<250 copies / mL). A negative result must be combined with clinical observations, patient history, and epidemiological information. Fact Sheet for Patients:   StrictlyIdeas.no Fact Sheet for Healthcare Providers: BankingDealers.co.za This test is not yet approved or cleared  by the [place] FDA and has been authorized for detection and/or diagnosis of SARS-CoV-2 by FDA under an Emergency Use Authorization (EUA).  This EUA will remain in effect (meaning this test can be used) for the duration of the COVID-19 declaration under Section 564(b)(1) of the Act, 21 U.S.C. section 360bbb-3(b)(1), unless the authorization is terminated or revoked sooner. Performed at [facility],  [place] [address] [place], [place] [address]   MRSA PCR Screening     Status: None   Collection Time: 10/27/19 11:00 AM   Specimen: Nasal Mucosa; Nasopharyngeal  Result Value Ref Range Status   MRSA by PCR NEGATIVE NEGATIVE Final    Comment:        The GeneXpert MRSA Assay (FDA approved for NASAL specimens only), is one component of a comprehensive MRSA colonization surveillance program. It is not intended to diagnose MRSA infection nor to guide or monitor treatment for MRSA infections. Performed at [facility], [place] [address] [place], [place] [address]         Radiology Studies: No results found.      Scheduled Meds: . Chlorhexidine Gluconate Cloth  6 each Topical Q0600  . diphenhydrAMINE  25 mg Oral BID  . feeding supplement  1 Container Oral Q24H  . heparin  5,000 Units Subcutaneous Q8H  . LORazepam  1 mg Oral TID  . multivitamin with minerals  1 tablet Oral Daily   Continuous Infusions:   LOS: 4 days     Cordelia Poche, MD [facility] 10/31/2019, 11:50 AM  If 7PM-7AM, please contact night-coverage [email or web address]

## 2019-10-31 NOTE — Progress Notes (Signed)
Patient continues to refuse heparin. He has been educated and verbalized understanding of education. Patient slept well overnight. No complaints of extra tongue or neck movements. No other complaints or distress noted.

## 2019-11-01 MED ORDER — LIP MEDEX EX OINT
TOPICAL_OINTMENT | CUTANEOUS | Status: DC | PRN
  Filled 2019-11-01: qty 7

## 2019-11-01 NOTE — TOC Progression Note (Addendum)
Transition of Care [facility]) - Progression Note    Patient Details  Name: Patrick [name] MRN: [id number] Date of Birth: 2001-01-19  Transition of Care [facility]) CM/SW Contact  [name], [place] Phone Number: 11/01/2019, 9:33 AM  Clinical Narrative:   Spoke with Aqua at [facility].  Dr will review and she will get back with me. TOC will continue to follow during the course of hospitalization.  Addendum:  Received call from [facility] asking if we were still looking for bed.  They are reviewing.     Expected Discharge Plan: Psychiatric Hospital Barriers to Discharge: Continued Medical Work up  Expected Discharge Plan and Services Expected Discharge Plan: Psychiatric Hospital   Discharge Planning Services: CM Consult   Living arrangements for the past 2 months: Single Family Home                                       Social Determinants of Health (SDOH) Interventions    Readmission Risk Interventions No flowsheet data found.

## 2019-11-01 NOTE — Progress Notes (Signed)
PROGRESS NOTE    [name]  DEY:[id number] DOB: [birth year] DOA: 10/27/2019 PCP: Clementeen Graham, PA-C   Brief Narrative: Patrick [name] is a 19 y.o. male with a history of polysubstance abuse and depression presented secondary to intentional drug overdose requiring intubation and mechanical ventilation. Patient has now improved and is awaiting behavioral health admission.   Assessment & Plan:   Principal Problem:   Intentional drug overdose (HCC) Active Problems:   Acute hypoxemic respiratory failure (HCC)   Intentional drug overdose Patient took unknown amounts of Trazodone, Zyprexa, Zoloft, Atarax with the intention of committing suicide. Psychiatry consulted and is recommending inpatient behavioral health admission. -Psychiatry recommendations for medications: Inpatient behavioral health admission  Acute respiratory failure with hypoxemia Secondary to above. Required intubation for airway protection and mechanical ventilation from 5/26 until 5/27. Currently on room air. Resolved.  Acute metabolic encephalopathy Secondary to drug overdose. Resolved.  Possibly seizure In setting of overdose. No recurrent seizures noted.  Dystonia Extrapyramidal symptoms Likely secondary to haldol which was discontinued. Symptoms improved. Cogentin also discontinued. Symptoms treated with Ativan x48 hours. Appears resolved.   DVT prophylaxis: Heparin subq Code Status:   Code Status: Full Code Family Communication: None at bedside Disposition Plan: Discharge to [facility] when bed is available. Medically stable for discharge.   Consultants:   Psychiatry  PCCM  Procedures:   Intubation  Antimicrobials:  None    Subjective: Hoping to be discharged. Bargaining for discharge.  Objective: Vitals:   10/31/19 1401 10/31/19 2139 11/01/19 0553 11/01/19 0557  BP: 112/66 115/71 108/85   Pulse: (!) 102 86 (!) 106   Resp: 16 20 16    Temp: 98.7 F (37.1 C) 97.8 F (36.6 C) 98.4 F  (36.9 C)   TempSrc: Oral Oral Oral   SpO2: 100% 100% 100%   Weight:    61.5 kg  Height:        Intake/Output Summary (Last 24 hours) at 11/01/2019 1108 Last data filed at 11/01/2019 0900 Gross per 24 hour  Intake 1680 ml  Output 0 ml  Net 1680 ml   Filed Weights   10/29/19 0420 10/31/19 0500 11/01/19 0557  Weight: 61.8 kg 60.8 kg 61.5 kg    Examination:  General exam: Appears calm and comfortable Respiratory system: Clear to auscultation. Respiratory effort normal. Cardiovascular system: S1 & S2 heard, RRR. No murmurs, rubs, gallops or clicks. Gastrointestinal system: Abdomen is nondistended, soft and nontender. No organomegaly or masses felt. Normal bowel sounds heard. Central nervous system: Alert and oriented. No focal neurological deficits. Musculoskeletal: No edema. No calf tenderness Skin: No cyanosis. No rashes Psychiatry: Mood & affect appropriate.     Data Reviewed: I have personally reviewed following labs and imaging studies  CBC Lab Results  Component Value Date   WBC 5.9 10/28/2019   RBC 4.91 10/28/2019   HGB 13.1 10/28/2019   HCT 40.9 10/28/2019   MCV 83.3 10/28/2019   MCH 26.7 10/28/2019   PLT 210 10/28/2019   MCHC 32.0 10/28/2019   RDW 14.0 10/28/2019   LYMPHSABS 0.8 10/27/2019   MONOABS 0.5 10/27/2019   EOSABS 0.1 10/27/2019   BASOSABS 0.0 10/27/2019     Last metabolic panel Lab Results  Component Value Date   NA 141 10/29/2019   K 3.7 10/29/2019   CL 110 10/29/2019   CO2 23 10/29/2019   BUN 8 10/29/2019   CREATININE 0.87 10/29/2019   GLUCOSE 95 10/29/2019   GFRNONAA >60 10/29/2019   GFRAA >60 10/29/2019  CALCIUM 8.6 (L) 10/29/2019   PHOS 3.1 10/28/2019   PROT 6.2 (L) 10/28/2019   ALBUMIN 3.8 10/28/2019   BILITOT 0.8 10/28/2019   ALKPHOS 50 10/28/2019   AST 15 10/28/2019   ALT 13 10/28/2019   ANIONGAP 8 10/29/2019    CBG (last 3)  No results for input(s): GLUCAP in the last 72 hours.   GFR: Estimated Creatinine  Clearance: 116.8 mL/min (by C-G formula based on SCr of 0.87 mg/dL).  Coagulation Profile: No results for input(s): INR, PROTIME in the last 168 hours.  Recent Results (from the past 240 hour(s))  SARS Coronavirus 2 by RT PCR (hospital order, performed in [facility] hospital lab) Nasopharyngeal Nasopharyngeal Swab     Status: None   Collection Time: 10/27/19  4:30 AM   Specimen: Nasopharyngeal Swab  Result Value Ref Range Status   SARS Coronavirus 2 NEGATIVE NEGATIVE Final    Comment: (NOTE) SARS-CoV-2 target nucleic acids are NOT DETECTED. The SARS-CoV-2 RNA is generally detectable in upper and lower respiratory specimens during the acute phase of infection. The lowest concentration of SARS-CoV-2 viral copies this assay can detect is 250 copies / mL. A negative result does not preclude SARS-CoV-2 infection and should not be used as the sole basis for treatment or other patient management decisions.  A negative result may occur with improper specimen collection / handling, submission of specimen other than nasopharyngeal swab, presence of viral mutation(s) within the areas targeted by this assay, and inadequate number of viral copies (<250 copies / mL). A negative result must be combined with clinical observations, patient history, and epidemiological information. Fact Sheet for Patients:   StrictlyIdeas.no Fact Sheet for Healthcare Providers: BankingDealers.co.za This test is not yet approved or cleared  by the [place] FDA and has been authorized for detection and/or diagnosis of SARS-CoV-2 by FDA under an Emergency Use Authorization (EUA).  This EUA will remain in effect (meaning this test can be used) for the duration of the COVID-19 declaration under Section 564(b)(1) of the Act, 21 U.S.C. section 360bbb-3(b)(1), unless the authorization is terminated or revoked sooner. Performed at [facility], [place]  [address] [place], [place] [address]   MRSA PCR Screening     Status: None   Collection Time: 10/27/19 11:00 AM   Specimen: Nasal Mucosa; Nasopharyngeal  Result Value Ref Range Status   MRSA by PCR NEGATIVE NEGATIVE Final    Comment:        The GeneXpert MRSA Assay (FDA approved for NASAL specimens only), is one component of a comprehensive MRSA colonization surveillance program. It is not intended to diagnose MRSA infection nor to guide or monitor treatment for MRSA infections. Performed at [facility], [place] [address] [place], [place] [address]         Radiology Studies: No results found.      Scheduled Meds: . Chlorhexidine Gluconate Cloth  6 each Topical Q0600  . diphenhydrAMINE  25 mg Oral BID  . feeding supplement  1 Container Oral Q24H  . heparin  5,000 Units Subcutaneous Q8H  . multivitamin with minerals  1 tablet Oral Daily   Continuous Infusions:   LOS: 5 days     Cordelia Poche, MD [facility] 11/01/2019, 11:08 AM  If 7PM-7AM, please contact night-coverage [email or web address]

## 2019-11-02 MED ORDER — OLANZAPINE 10 MG PO TABS
10.0000 mg | ORAL_TABLET | Freq: Every day | ORAL | Status: AC
  Administered 2019-11-02: 10 mg via ORAL
  Filled 2019-11-02 (×2): qty 1

## 2019-11-02 NOTE — Progress Notes (Addendum)
Patient wants to be discharged. Read over psych note. Sent AMION page to Dr. Caleb Popp.   Spoke w/ Dr. Caleb Popp. He informed me that patient should discharge tomorrow. Waiting for outpatient resources from SW, monitoring patient w/ new zyprexa medication.   Informed patient of this and patient verbalized understanding.

## 2019-11-02 NOTE — Plan of Care (Signed)
  Problem: Safety: Goal: Ability to remain free from injury will improve Outcome: Progressing   Problem: Pain Managment: Goal: General experience of comfort will improve Outcome: Progressing   Problem: Activity: Goal: Risk for activity intolerance will decrease Outcome: Progressing   Problem: Health Behavior/Discharge Planning: Goal: Ability to manage health-related needs will improve Outcome: Progressing   Problem: Education: Goal: Knowledge of General Education information will improve Description: Including pain rating scale, medication(s)/side effects and non-pharmacologic comfort measures Outcome: Progressing

## 2019-11-02 NOTE — Consult Note (Signed)
[facility] Face-to-Face Psychiatry Consult   Reason for Consult:  "Intentional overdose awaiting transfer to [facility]. Now with agitation and outbursts. Had dystonia with antipsychotic in the past." Referring Physician:  Dr. Caleb Popp Patient Identification: Patrick [name] MRN:  [id number] Principal Diagnosis: Intentional drug overdose [facility]) Diagnosis:  Principal Problem:   Intentional drug overdose (HCC) Active Problems:   Acute hypoxemic respiratory failure (HCC)   Total Time spent with patient: 1 hour  Subjective: Patient states "I had the world on my shoulders and I was the type of person that I thought nobody would understand me but I have had a lot of time to think in the hospital."  Patient reports "I have a little girl on the way and I want to be here for her, I have been talking to God while I am here, I want to reverse all the negatives to positive."  Patient also reports an upcoming court date on June 8 that he would like to attend to avoid further charges including failure to appear.  HPI: Patrick [name] is a 19 y.o. male patient.  Patient assessed by nurse practitioner.  Patient alert and oriented, answers appropriately. Patient reports he would like to go home today he is concerned about losing his employment. Patient denies suicidal ideations currently.  Patient reports feeling overwhelmed prior to his suicide attempt on 5/26.  Patient states "if I had done that I would have hurt a lot of people, I want the best help from myself." Patient denies homicidal ideations.  Patient denies auditory visual hallucinations.  Patient denies symptoms of paranoia. Patient reports that he lives with his mother.  Patient denies access to weapons.  Patient currently employed at a [employer].  Patient denies current outpatient psychiatrist.  Patient states "I have been off my medications for some months, I cannot remember them but I know I have medication for anxiety and for sleep."  Patient reports  discontinued suggested Haldol related to "I was twitching in my tongue was rolling." Patient reports willingness to have mother manage his medications once discharged.  Patient states "I would turn the medications over to her, not because I would hurt myself, I would do that for everybody to be comfortable."  Patient reports plan to be compliant with medications moving forward. Patient gives verbal consent to speak with his mother at bedside, Vernona Rieger phone number [phone]. Patient's mother denies concerns for patient if he were to return home.  Patient's mother reports "I know you would not hurt anyone."  Patient's mother reports if patient were to discharge she would "take time away from work and ensure that he attends his outpatient follow-up visits." Patient's mother also would like for patient to receive inpatient treatment.  Patient's mother reports "I know that he is frustrated and he does not like waiting, I wish there were an opening for him to move across the street today."    Past Psychiatric History: Disruptive mood dysregulation disorder, major depressive disorder, intermittent explosive disorder, cannabis use disorder  Risk to Self:  Currently denies Risk to Others:  Denies Prior Inpatient Therapy:  : Behavioral health November 2020 Prior Outpatient Therapy:   Denies currently  Past Medical History:  Past Medical History:  Diagnosis Date   Anxiety    Depressive disorder 10/10/2015    Past Surgical History:  Procedure Laterality Date   HERNIA REPAIR     Family History:  Family History  Problem Relation Age of Onset   Heart failure Maternal Grandmother  Atrial fibrillation Maternal Grandmother    Heart attack Other        maternal uncle had MI at age 13   Family Psychiatric  History: None reported Social History:  Social History   Substance and Sexual Activity  Alcohol Use Yes     Social History   Substance and Sexual Activity  Drug Use Yes    Types: Marijuana    Social History   Socioeconomic History   Marital status: Single    Spouse name: Not on file   Number of children: Not on file   Years of education: Not on file   Highest education level: Not on file  Occupational History   Not on file  Tobacco Use   Smoking status: Current Some Day Smoker    Types: Cigars   Smokeless tobacco: Never Used  Substance and Sexual Activity   Alcohol use: Yes   Drug use: Yes    Types: Marijuana   Sexual activity: Yes    Birth control/protection: None  Other Topics Concern   Not on file  Social History Narrative   ** Merged History Encounter **       Social Determinants of Health   Financial Resource Strain:    Difficulty of Paying Living Expenses:   Food Insecurity:    Worried About Charity fundraiser in the Last Year:    Arboriculturist in the Last Year:   Transportation Needs:    Film/video editor (Medical):    Lack of Transportation (Non-Medical):   Physical Activity:    Days of Exercise per Week:    Minutes of Exercise per Session:   Stress:    Feeling of Stress :   Social Connections:    Frequency of Communication with Friends and Family:    Frequency of Social Gatherings with Friends and Family:    Attends Religious Services:    Active Member of Clubs or Organizations:    Attends Archivist Meetings:    Marital Status:    Additional Social History:    Allergies:   Allergies  Allergen Reactions   Bee Venom Anaphylaxis    unknown    Labs: No results found for this or any previous visit (from the past 48 hour(s)).  Current Facility-Administered Medications  Medication Dose Route Frequency Provider Last Rate Last Admin   acetaminophen (TYLENOL) tablet 650 mg  650 mg Oral Q4H PRN Frederik Pear, MD   650 mg at 10/29/19 0138   Chlorhexidine Gluconate Cloth 2 % PADS 6 each  6 each Topical Q0600 Alma Friendly, MD   6 each at 10/31/19 1035    diphenhydrAMINE (BENADRYL) capsule 25 mg  25 mg Oral BID Corena Pilgrim, MD   25 mg at 11/01/19 2115   feeding supplement (BOOST / RESOURCE BREEZE) liquid 1 Container  1 Container Oral Q24H Chesley Mires, MD   1 Container at 11/01/19 4431   heparin injection 5,000 Units  5,000 Units Subcutaneous Q8H Omar Person, NP   5,000 Units at 10/28/19 1330   lip balm (CARMEX) ointment   Topical PRN Mariel Aloe, MD   Given at 11/01/19 1353   multivitamin with minerals tablet 1 tablet  1 tablet Oral Daily Chesley Mires, MD   1 tablet at 10/31/19 1034    Musculoskeletal: Strength & Muscle Tone: within normal limits Gait & Station: normal Patient leans: N/A  Psychiatric Specialty Exam: Physical Exam  Nursing note and vitals reviewed. Constitutional: He  is oriented to person, place, and time. He appears well-developed.  HENT:  Head: Normocephalic.  Cardiovascular: Normal rate.  Respiratory: Effort normal.  Musculoskeletal:     Cervical back: Normal range of motion.  Neurological: He is alert and oriented to person, place, and time.  Psychiatric: He has a normal mood and affect. His speech is normal and behavior is normal. Judgment and thought content normal. Cognition and memory are normal.    Review of Systems  Constitutional: Negative.   HENT: Negative.   Eyes: Negative.   Respiratory: Negative.   Cardiovascular: Negative.   Gastrointestinal: Negative.   Genitourinary: Negative.   Musculoskeletal: Negative.   Skin: Negative.   Neurological: Negative.   Psychiatric/Behavioral: Negative.     Blood pressure 110/61, pulse 83, temperature 97.9 F (36.6 C), temperature source Oral, resp. rate 16, height 5\' 10"  (1.778 m), weight 58.5 kg, SpO2 100 %.Body mass index is 18.51 kg/m.  General Appearance: Casual and Fairly Groomed  Eye Contact:  Good  Speech:  Clear and Coherent and Normal Rate  Volume:  Normal  Mood:  Euthymic  Affect:  Appropriate and Congruent  Thought Process:   Coherent, Goal Directed and Descriptions of Associations: Intact  Orientation:  Full (Time, Place, and Person)  Thought Content:  WDL  Suicidal Thoughts:  No  Homicidal Thoughts:  No  Memory:  Immediate;   Good Recent;   Good Remote;   Good  Judgement:  Fair  Insight:  Good  Psychomotor Activity:  Normal  Concentration:  Concentration: Good and Attention Span: Good  Recall:  Good  Fund of Knowledge:  Good  Language:  Good  Akathisia:  No  Handed:  Right  AIMS (if indicated):     Assets:  Communication Skills Desire for Improvement Financial Resources/Insurance Housing Intimacy Leisure Time Physical Health Resilience Social Support  ADL's:  Intact  Cognition:  WNL  Sleep:        Treatment Plan Summary: Patient discussed with Dr. .  Patient is a 19 year old male, presents after suspected intentional overdose.  Patient is cooperative and pleasant, denies depressive symptoms, denies suicidal ideations.  Based on my assessment today patient would benefit from follow-up with outpatient psychiatrist as well as talk therapist.  Recommendations: Recommend Zyprexa 10mg  QHS.  Recommend follow up with outpatient psychiatry.    Disposition: No evidence of imminent risk to self or others at present.   Patient does not meet criteria for psychiatric inpatient admission. Supportive therapy provided about ongoing stressors.  36, FNP 11/02/2019 11:31 AM

## 2019-11-02 NOTE — Progress Notes (Signed)
PROGRESS NOTE    GABRIEN [name]  XKG:[id number] DOB: Jun 28, 2000 DOA: 10/27/2019 PCP: Clementeen Graham, PA-C   Brief Narrative: Patrick [name] is a 19 y.o. male with a history of polysubstance abuse and depression presented secondary to intentional drug overdose requiring intubation and mechanical ventilation. Patient has now improved and is awaiting behavioral health admission.   Assessment & Plan:   Principal Problem:   Intentional drug overdose (HCC) Active Problems:   Acute hypoxemic respiratory failure (HCC)   Intentional drug overdose Patient took unknown amounts of Trazodone, Zyprexa, Zoloft, Atarax with the intention of committing suicide. Psychiatry consulted and is recommending inpatient behavioral health admission. Patient currently agitated about being in the hospital and not yet having a transfer to behavioral health for treatment.  -Psychiatry recommendations for medications: Inpatient behavioral health admission -Have asked psychiatry to re-evaluate patient again today  Acute respiratory failure with hypoxemia Secondary to above. Required intubation for airway protection and mechanical ventilation from 5/26 until 5/27. Currently on room air. Resolved.  Acute metabolic encephalopathy Secondary to drug overdose. Resolved.  Possibly seizure In setting of overdose. No recurrent seizures noted.  Dystonia Extrapyramidal symptoms Likely secondary to haldol which was discontinued. Symptoms improved. Cogentin also discontinued. Symptoms treated with Ativan x48 hours. Appears resolved.   DVT prophylaxis: Heparin subq Code Status:   Code Status: Full Code Family Communication: None at bedside Disposition Plan: Discharge to behavioral health hospital when bed is available. Medically stable for discharge.   Consultants:   Psychiatry  PCCM  Procedures:   Intubation  Antimicrobials:  None    Subjective: Wants to go home. Tired of waiting for inpatient  psychiatry transfer.  Objective: Vitals:   11/01/19 0557 11/01/19 1300 11/01/19 2108 11/02/19 0612  BP:  124/71 114/72 110/61  Pulse:  91 85 83  Resp:  17 18 16   Temp:  98.8 F (37.1 C) 98 F (36.7 C) 97.9 F (36.6 C)  TempSrc:  Oral Oral Oral  SpO2:  100% 97% 100%  Weight: 61.5 kg   58.5 kg  Height:        Intake/Output Summary (Last 24 hours) at 11/02/2019 1024 Last data filed at 11/02/2019 1000 Gross per 24 hour  Intake 1200 ml  Output 0 ml  Net 1200 ml   Filed Weights   10/31/19 0500 11/01/19 0557 11/02/19 0612  Weight: 60.8 kg 61.5 kg 58.5 kg    Examination:  General exam: Appears agitated Central nervous system: Alert and oriented. No focal neurological deficits. Musculoskeletal: No edema. No calf tenderness Skin: No cyanosis. No rashes Psychiatry: Judgement and insight appear normal. Agitated mood    Data Reviewed: I have personally reviewed following labs and imaging studies  CBC Lab Results  Component Value Date   WBC 5.9 10/28/2019   RBC 4.91 10/28/2019   HGB 13.1 10/28/2019   HCT 40.9 10/28/2019   MCV 83.3 10/28/2019   MCH 26.7 10/28/2019   PLT 210 10/28/2019   MCHC 32.0 10/28/2019   RDW 14.0 10/28/2019   LYMPHSABS 0.8 10/27/2019   MONOABS 0.5 10/27/2019   EOSABS 0.1 10/27/2019   BASOSABS 0.0 10/27/2019     Last metabolic panel Lab Results  Component Value Date   NA 141 10/29/2019   K 3.7 10/29/2019   CL 110 10/29/2019   CO2 23 10/29/2019   BUN 8 10/29/2019   CREATININE 0.87 10/29/2019   GLUCOSE 95 10/29/2019   GFRNONAA >60 10/29/2019   GFRAA >60 10/29/2019   CALCIUM 8.6 (L) 10/29/2019  PHOS 3.1 10/28/2019   PROT 6.2 (L) 10/28/2019   ALBUMIN 3.8 10/28/2019   BILITOT 0.8 10/28/2019   ALKPHOS 50 10/28/2019   AST 15 10/28/2019   ALT 13 10/28/2019   ANIONGAP 8 10/29/2019    CBG (last 3)  No results for input(s): GLUCAP in the last 72 hours.   GFR: Estimated Creatinine Clearance: 111.1 mL/min (by C-G formula based on SCr of  0.87 mg/dL).  Coagulation Profile: No results for input(s): INR, PROTIME in the last 168 hours.  Recent Results (from the past 240 hour(s))  SARS Coronavirus 2 by RT PCR (hospital order, performed in [facility] hospital lab) Nasopharyngeal Nasopharyngeal Swab     Status: None   Collection Time: 10/27/19  4:30 AM   Specimen: Nasopharyngeal Swab  Result Value Ref Range Status   SARS Coronavirus 2 NEGATIVE NEGATIVE Final    Comment: (NOTE) SARS-CoV-2 target nucleic acids are NOT DETECTED. The SARS-CoV-2 RNA is generally detectable in upper and lower respiratory specimens during the acute phase of infection. The lowest concentration of SARS-CoV-2 viral copies this assay can detect is 250 copies / mL. A negative result does not preclude SARS-CoV-2 infection and should not be used as the sole basis for treatment or other patient management decisions.  A negative result may occur with improper specimen collection / handling, submission of specimen other than nasopharyngeal swab, presence of viral mutation(s) within the areas targeted by this assay, and inadequate number of viral copies (<250 copies / mL). A negative result must be combined with clinical observations, patient history, and epidemiological information. Fact Sheet for Patients:   StrictlyIdeas.no Fact Sheet for Healthcare Providers: BankingDealers.co.za This test is not yet approved or cleared  by the [place] FDA and has been authorized for detection and/or diagnosis of SARS-CoV-2 by FDA under an Emergency Use Authorization (EUA).  This EUA will remain in effect (meaning this test can be used) for the duration of the COVID-19 declaration under Section 564(b)(1) of the Act, 21 U.S.C. section 360bbb-3(b)(1), unless the authorization is terminated or revoked sooner. Performed at [facility], [place] [address], [place], [place] [address]   MRSA PCR Screening      Status: None   Collection Time: 10/27/19 11:00 AM   Specimen: Nasal Mucosa; Nasopharyngeal  Result Value Ref Range Status   MRSA by PCR NEGATIVE NEGATIVE Final    Comment:        The GeneXpert MRSA Assay (FDA approved for NASAL specimens only), is one component of a comprehensive MRSA colonization surveillance program. It is not intended to diagnose MRSA infection nor to guide or monitor treatment for MRSA infections. Performed at [facility], [place] [address], [place], [place] [address]         Radiology Studies: No results found.      Scheduled Meds: . Chlorhexidine Gluconate Cloth  6 each Topical Q0600  . diphenhydrAMINE  25 mg Oral BID  . feeding supplement  1 Container Oral Q24H  . heparin  5,000 Units Subcutaneous Q8H  . multivitamin with minerals  1 tablet Oral Daily   Continuous Infusions:   LOS: 6 days     Cordelia Poche, MD [facility] 11/02/2019, 10:24 AM  If 7PM-7AM, please contact night-coverage [email or web address]

## 2019-11-02 NOTE — TOC Transition Note (Signed)
Transition of Care [facility]) - CM/SW Discharge Note   Patient Details  Name: Patrick [name] MRN: [id number] Date of Birth: 2001-05-09  Transition of Care [facility]) CM/SW Contact:  Trish Mage, LCSW Phone Number: 11/02/2019, 3:18 PM   Clinical Narrative:   Met with patient in follow up to psychiatry recommendation of outpatient mental health services.  [name] states he has a couple of things restricting his options for services.  He is working, and he has an ankle monitor that restricts his range and times for services.  Psychiatry had checked with our [place] [facility], and they were unwilling to work with patient due to dual diagnosis.  They furthermore recommended [facility] dual diagnosis program, but patient states that would not work based on above restrictions.  Will refer to [facility] for psychiatry and individual therapy. TOC will continue to follow during the course of hospitalization.     Final next level of care: Home/Self Care Barriers to Discharge: No Barriers Identified   Patient Goals and CMS Choice Patient states their goals for this hospitalization and ongoing recovery are:: unable to state on the vent      Discharge Placement                       Discharge Plan and Services   Discharge Planning Services: CM Consult                                 Social Determinants of Health (SDOH) Interventions     Readmission Risk Interventions No flowsheet data found.

## 2019-11-03 MED ORDER — OLANZAPINE 10 MG PO TABS
10.0000 mg | ORAL_TABLET | Freq: Every day | ORAL | 0 refills | Status: DC
Start: 2019-11-03 — End: 2020-05-01

## 2019-11-03 NOTE — TOC Transition Note (Signed)
Transition of Care [facility]) - CM/SW Discharge Note   Patient Details  Name: Patrick [name] MRN: [id number] Date of Birth: 11/26/2000  Transition of Care [facility] CM/SW Contact:  [name], LCSW Phone Number: 11/03/2019, 10:05 AM   Clinical Narrative:   Patient to d/c home today.  He was set up for services at [facility] for psychiatric follow up, as well as therapy.  He was also given information on [facility] program.  No further needs identified. TOC sign off.    Final next level of care: Home/Self Care Barriers to Discharge: No Barriers Identified   Patient Goals and CMS Choice Patient states their goals for this hospitalization and ongoing recovery are:: unable to state on the vent      Discharge Placement                       Discharge Plan and Services   Discharge Planning Services: CM Consult                                 Social Determinants of Health (SDOH) Interventions     Readmission Risk Interventions No flowsheet data found.
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
## 2020-05-01 ENCOUNTER — Ambulatory Visit
Admission: EM | Admit: 2020-05-01 | Discharge: 2020-05-01 | Disposition: A | Discharge status: Home / Self Care | Payer: 59 | Attending: Family Medicine | Admitting: Family Medicine

## 2020-05-01 ENCOUNTER — Other Ambulatory Visit: Payer: Self-pay

## 2020-05-01 DIAGNOSIS — Z202 Contact with and (suspected) exposure to infections with a predominantly sexual mode of transmission: Secondary | ICD-10-CM | POA: Diagnosis not present

## 2020-05-01 MED ORDER — DOXYCYCLINE HYCLATE 100 MG PO TABS
100.0000 mg | ORAL_TABLET | Freq: Two times a day (BID) | ORAL | 0 refills | Status: DC
Start: 2020-05-01 — End: 2020-07-28
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
## 2020-05-01 NOTE — Discharge Instructions (Addendum)
I do not believe that the kind of exposure you have would lead to an STD.  We are doing the appropriate tests and they should be back tomorrow afternoon.  You can go to mychart to see the results and we will call you if they are positive

## 2020-05-01 NOTE — ED Triage Notes (Signed)
Pt requesting STD check-denies penile d/c-NAD-steady gait

## 2020-05-02 LAB — CYTOLOGY, (ORAL, ANAL, URETHRAL) ANCILLARY ONLY
Chlamydia: NEGATIVE
Comment: NEGATIVE
Comment: NEGATIVE
Comment: NORMAL
Neisseria Gonorrhea: NEGATIVE
Trichomonas: NEGATIVE

## 2020-07-28 ENCOUNTER — Emergency Department (HOSPITAL_COMMUNITY)
Admission: EM | Admit: 2020-07-28 | Discharge: 2020-07-28 | Disposition: A | Discharge status: Home / Self Care | Payer: No Typology Code available for payment source | Attending: Emergency Medicine | Admitting: Emergency Medicine

## 2020-07-28 ENCOUNTER — Emergency Department (HOSPITAL_COMMUNITY): Payer: No Typology Code available for payment source

## 2020-07-28 ENCOUNTER — Encounter (HOSPITAL_COMMUNITY): Payer: Self-pay | Admitting: Emergency Medicine

## 2020-07-28 ENCOUNTER — Other Ambulatory Visit: Payer: Self-pay

## 2020-07-28 DIAGNOSIS — M549 Dorsalgia, unspecified: Secondary | ICD-10-CM | POA: Diagnosis present

## 2020-07-28 DIAGNOSIS — W010XXA Fall on same level from slipping, tripping and stumbling without subsequent striking against object, initial encounter: Secondary | ICD-10-CM | POA: Insufficient documentation

## 2020-07-28 DIAGNOSIS — Y9389 Activity, other specified: Secondary | ICD-10-CM | POA: Diagnosis not present

## 2020-07-28 DIAGNOSIS — F17290 Nicotine dependence, other tobacco product, uncomplicated: Secondary | ICD-10-CM | POA: Diagnosis not present

## 2020-07-28 DIAGNOSIS — M5442 Lumbago with sciatica, left side: Secondary | ICD-10-CM | POA: Diagnosis not present

## 2020-07-28 MED ORDER — DEXAMETHASONE SODIUM PHOSPHATE 10 MG/ML IJ SOLN
10.0000 mg | Freq: Once | INTRAMUSCULAR | Status: AC
  Administered 2020-07-28: 10 mg via INTRAMUSCULAR
  Filled 2020-07-28: qty 1

## 2020-07-28 MED ORDER — NAPROXEN 375 MG PO TABS
375.0000 mg | ORAL_TABLET | Freq: Two times a day (BID) | ORAL | 0 refills | Status: DC
Start: 2020-07-28 — End: 2021-11-02

## 2020-07-28 MED ORDER — METHOCARBAMOL 500 MG PO TABS
500.0000 mg | ORAL_TABLET | Freq: Two times a day (BID) | ORAL | 0 refills | Status: DC
Start: 2020-07-28 — End: 2021-11-02

## 2020-07-28 MED ORDER — KETOROLAC TROMETHAMINE 60 MG/2ML IM SOLN
60.0000 mg | Freq: Once | INTRAMUSCULAR | Status: AC
  Administered 2020-07-28: 60 mg via INTRAMUSCULAR
  Filled 2020-07-28: qty 2

## 2020-07-28 MED ORDER — DIAZEPAM 5 MG PO TABS
10.0000 mg | ORAL_TABLET | Freq: Once | ORAL | Status: AC
  Administered 2020-07-28: 10 mg via ORAL
  Filled 2020-07-28: qty 2

## 2020-07-28 NOTE — Discharge Instructions (Addendum)
Take naproxen as directed for pain.  Take Robaxin as prescribed. This medication will make you drowsy so do not drive or drink alcohol when taking it.  Return to the Emergency Department immediately for any worsening back pain, neck pain, difficulty walking, numbness/weaknss of your arms or legs, urinary or bowel accidents, fever or any other worsening or concerning symptoms.
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
## 2020-07-28 NOTE — ED Triage Notes (Signed)
BIBA Per EMS: Pt had a fall x3 weeks ago on ice and has been experiencing some back pain. Pt had another all again this morning on concrete and felt a pop in back. Pt has pain in back and in butt going down his leg. All vitals WDL

## 2021-01-29 ENCOUNTER — Ambulatory Visit: Payer: Self-pay

## 2021-01-31 ENCOUNTER — Other Ambulatory Visit: Payer: Self-pay

## 2021-01-31 ENCOUNTER — Ambulatory Visit
Admission: RE | Admit: 2021-01-31 | Discharge: 2021-01-31 | Disposition: A | Discharge status: Home / Self Care | Payer: 59 | Source: Ambulatory Visit | Attending: Internal Medicine | Admitting: Internal Medicine

## 2021-01-31 VITALS — BP 112/47 | HR 104 | Temp 98.5°F | Resp 16

## 2021-01-31 DIAGNOSIS — Z113 Encounter for screening for infections with a predominantly sexual mode of transmission: Secondary | ICD-10-CM | POA: Diagnosis present

## 2021-01-31 DIAGNOSIS — N489 Disorder of penis, unspecified: Secondary | ICD-10-CM | POA: Insufficient documentation

## 2021-01-31 DIAGNOSIS — N3001 Acute cystitis with hematuria: Secondary | ICD-10-CM | POA: Insufficient documentation

## 2021-01-31 LAB — POCT URINALYSIS DIP (MANUAL ENTRY)
Bilirubin, UA: NEGATIVE
Glucose, UA: NEGATIVE mg/dL
Ketones, POC UA: NEGATIVE mg/dL
Nitrite, UA: NEGATIVE
Protein Ur, POC: 100 mg/dL — AB
Spec Grav, UA: 1.02 (ref 1.010–1.025)
Urobilinogen, UA: 0.2 E.U./dL
pH, UA: 5.5 (ref 5.0–8.0)

## 2021-01-31 MED ORDER — DOXYCYCLINE HYCLATE 100 MG PO CAPS: 100.0000 mg | ORAL_CAPSULE | Freq: Two times a day (BID) | ORAL | 0 refills | Status: DC

## 2021-01-31 NOTE — Discharge Instructions (Addendum)
Your urine is showing signs of urinary tract infection.  You are being treated with doxycycline antibiotic.  Your STD swab and herpes swab are pending.  We will call if these are positive.  Urine culture is also pending.  Please refrain from any sexual activity until test results are complete.

## 2021-01-31 NOTE — ED Triage Notes (Signed)
Suprapubic abdominal pain starting yesterday after having unprotected sex daily with multiple sexual partners. Small singular bump to pubic area after shaving he wants examined. Aside from abdominal pain, denies fever, dysuria, itching, rash, discharge

## 2021-01-31 NOTE — ED Provider Notes (Signed)
[name]    CSN: [id number] Arrival date & time: 01/31/21  1103      History   Chief Complaint Chief Complaint  Patient presents with   SEXUALLY TRANSMITTED DISEASE    HPI Patrick [name] is a 20 y.o. male.   Patient presents with lower abdominal pain that started yesterday.  Denies any penile discharge, urinary burning, urinary frequency, pelvic pain, back pain, fever, chills.  Patient states that symptoms started after having unprotected sex with multiple sexual partners.  Denies any known exposure to STD.  Also has a lesion to shaft of penis that he is concerned about.  Denies that lesion has drained any fluids.  Patient is concerned that this lesion is from shaving.    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 Authorizing Provider  doxycycline (VIBRAMYCIN) 100 MG capsule Take 1 capsule (100 mg total) by mouth 2 (two) times daily. 01/31/21  Yes Lance Muss, FNP  methocarbamol (ROBAXIN) 500 MG tablet Take 1 tablet (500 mg total) by mouth 2 (two) times daily. 07/28/20   Maxwell Caul, PA-C  naproxen (NAPROSYN) 375 MG tablet Take 1 tablet (375 mg total) by mouth 2 (two) times daily. 07/28/20   Maxwell Caul, PA-C    Family History Family History   Problem Relation Age of Onset   Heart failure Maternal Grandmother    Atrial fibrillation Maternal Grandmother    Heart attack Other        maternal uncle had MI at age 31    Social History Social History   Tobacco Use   Smoking status: Some Days    Types: Cigars   Smokeless tobacco: Never  Vaping Use   Vaping Use: Never used  Substance Use Topics   Alcohol use: Yes    Comment: weekly   Drug use: Not Currently    Types: Marijuana     Allergies   Bee venom   Review of Systems Review of Systems Per HPI  Physical Exam Triage Vital Signs ED Triage Vitals [01/31/21 1113]  Enc Vitals Group     BP (!) 112/47     Pulse Rate (!) 104     Resp 16     Temp 98.5 F (36.9 C)     Temp Source Oral     SpO2 94 %     Weight      Height      Head Circumference      Peak Flow      Pain Score 0     Pain Loc      Pain Edu?      Excl. in GC?    No data found.  Updated Vital Signs BP (!) 112/47 (BP Location: Left  Arm)   Pulse (!) 104   Temp 98.5 F (36.9 C) (Oral)   Resp 16   SpO2 94%   Visual Acuity Right Eye Distance:   Left Eye Distance:   Bilateral Distance:    Right Eye Near:   Left Eye Near:    Bilateral Near:     Physical Exam Exam conducted with a chaperone present.  Constitutional:      Appearance: Normal appearance.  HENT:     Head: Normocephalic and atraumatic.  Eyes:     Extraocular Movements: Extraocular movements intact.     Conjunctiva/sclera: Conjunctivae normal.  Cardiovascular:     Rate and Rhythm: Normal rate and regular rhythm.     Pulses: Normal pulses.     Heart sounds: Normal heart sounds.  Pulmonary:     Effort: Pulmonary effort is normal. No respiratory distress.     Breath sounds: Normal breath sounds.  Abdominal:     General: Abdomen is flat. Bowel sounds are normal. There is no distension.     Palpations: Abdomen is soft.     Tenderness: There is no abdominal tenderness.  Genitourinary:    Testes: Normal. Cremasteric  reflex is present.     Epididymis:     Right: Normal.     Left: Normal.     Comments: Small grouping of vesicular lesions to base of shaft of penis.  Self swab performed. Skin:    General: Skin is warm and dry.  Neurological:     General: No focal deficit present.     Mental Status: He is alert and oriented to person, place, and time. Mental status is at baseline.  Psychiatric:        Mood and Affect: Mood normal.        Behavior: Behavior normal.        Thought Content: Thought content normal.        Judgment: Judgment normal.     UC Treatments / Results  Labs (all labs ordered are listed, but only abnormal results are displayed) Labs Reviewed  POCT URINALYSIS DIP (MANUAL ENTRY) - Abnormal; Notable for the following components:      Result Value   Clarity, UA cloudy (*)    Blood, UA large (*)    Protein Ur, POC =100 (*)    Leukocytes, UA Large (3+) (*)    All other components within normal limits  URINE CULTURE  HSV CULTURE AND TYPING  CYTOLOGY, (ORAL, ANAL, URETHRAL) ANCILLARY ONLY    EKG   Radiology No results found.  Procedures Procedures (including critical care time)  Medications Ordered in UC Medications - No data to display  Initial Impression / Assessment and Plan / UC Course  I have reviewed the triage vital signs and the nursing notes.  Pertinent labs & imaging results that were available during my care of the patient were reviewed by me and considered in my medical decision making (see chart for details).     Vesicular lesions present to shaft of penis are suspicious of genital herpes.  HSV culture and typing pending.  Patient also has urethral cytology swab pending to rule out STDs.  Urinalysis showing large amounts of white blood cells that could indicate urinary tract infection.  Highly suspicious that STDs could be causing urinary tract infection symptoms.  Will treat with doxycycline antibiotic as this will treat urinary tract infection as well as  possible STDs.  Patient to refrain from sexual activity until test results are complete.Discussed strict return  precautions. Patient verbalized understanding and is agreeable with plan.  No red flags at this time. Final Clinical Impressions(s) / UC Diagnoses   Final diagnoses:  Acute cystitis with hematuria  Screening examination for venereal disease  Penile lesion     Discharge Instructions      Your urine is showing signs of urinary tract infection.  You are being treated with doxycycline antibiotic.  Your STD swab and herpes swab are pending.  We will call if these are positive.  Urine culture is also pending.  Please refrain from any sexual activity until test results are complete.     ED Prescriptions     Medication Sig Dispense Auth. Provider   doxycycline (VIBRAMYCIN) 100 MG capsule Take 1 capsule (100 mg total) by mouth 2 (two) times daily. 20 capsule Lance Muss, FNP      PDMP not reviewed this encounter.   Lance Muss, FNP 01/31/21 1148

## 2021-02-01 LAB — CYTOLOGY, (ORAL, ANAL, URETHRAL) ANCILLARY ONLY
Chlamydia: NEGATIVE
Comment: NEGATIVE
Comment: NEGATIVE
Comment: NORMAL
Neisseria Gonorrhea: NEGATIVE
Trichomonas: NEGATIVE

## 2021-02-03 LAB — URINE CULTURE: Culture: >=100000 — AB

## 2021-02-05 ENCOUNTER — Telehealth (HOSPITAL_COMMUNITY): Payer: Self-pay | Admitting: Emergency Medicine

## 2021-02-05 MED ORDER — CIPROFLOXACIN HCL 250 MG PO TABS: 250.0000 mg | ORAL_TABLET | Freq: Two times a day (BID) | ORAL | 0 refills | Status: AC

## 2021-02-05 MED ORDER — VALACYCLOVIR HCL 1 G PO TABS: 1000.0000 mg | ORAL_TABLET | Freq: Two times a day (BID) | ORAL | 0 refills | Status: AC

## 2021-02-06 LAB — HSV CULTURE AND TYPING

## 2021-02-20 ENCOUNTER — Other Ambulatory Visit: Payer: Self-pay

## 2021-02-20 ENCOUNTER — Ambulatory Visit
Admission: EM | Admit: 2021-02-20 | Discharge: 2021-02-20 | Disposition: A | Discharge status: Home / Self Care | Payer: 59 | Attending: Internal Medicine | Admitting: Internal Medicine

## 2021-02-20 DIAGNOSIS — Z113 Encounter for screening for infections with a predominantly sexual mode of transmission: Secondary | ICD-10-CM | POA: Diagnosis not present

## 2021-02-20 LAB — POCT URINALYSIS DIP (MANUAL ENTRY)
Bilirubin, UA: NEGATIVE
Blood, UA: NEGATIVE
Glucose, UA: NEGATIVE mg/dL
Ketones, POC UA: NEGATIVE mg/dL
Leukocytes, UA: NEGATIVE
Nitrite, UA: NEGATIVE
Protein Ur, POC: NEGATIVE mg/dL
Spec Grav, UA: 1.025 (ref 1.010–1.025)
Urobilinogen, UA: 0.2 E.U./dL
pH, UA: 6.5 (ref 5.0–8.0)

## 2021-02-20 NOTE — Discharge Instructions (Addendum)
Your urine culture and penile swab are pending.  We will call if these are positive.  Please consult with the pharmacy to see if valacyclovir medication is still at pharmacy.  You will need to take this medication.  Follow-up with urgent care if this medication is not there.  Refrain from any sexual activity until test results are complete.  Please use a condom when you are having sex.

## 2021-02-20 NOTE — ED Provider Notes (Signed)
[name]    CSN: [id number] Arrival date & time: 02/20/21  1250      History   Chief Complaint Chief Complaint  Patient presents with   STD testing     HPI Patrick [name] is a 20 y.o. male.   Patient presents for routine STD testing.  Patient is concerned because his girlfriend just tested positive for bacterial vaginosis, and is concerned that he could have acquired that as well.  Patient was seen previously on 01/31/2021.  Urine culture was positive for ENTEROBACTER AEROGENES, and patient was notified to switch from doxycycline antibiotic to Cipro antibiotic.  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  Valacyclovir was prescribed for patient.  Patient states that he did finish doxycycline antibiotic but did not pick up any of the other medications.  Patient denies any persistent urinary symptoms.  Denies any penile discharge, urinary burning, urinary frequency, pelvic pain, fever, abdominal pain, back pain, penile lesions.    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 Authorizing Provider  doxycycline (VIBRAMYCIN) 100 MG capsule Take 1 capsule (100 mg total) by mouth 2 (two) times daily. 01/31/21   Lance Muss, FNP  methocarbamol (ROBAXIN) 500 MG tablet  Take 1 tablet (500 mg total) by mouth 2 (two) times daily. 07/28/20   Maxwell Caul, PA-C  naproxen (NAPROSYN) 375 MG tablet Take 1 tablet (375 mg total) by mouth 2 (two) times daily. 07/28/20   Maxwell Caul, PA-C    Family History Family History  Problem Relation Age of Onset   Heart failure Maternal Grandmother    Atrial fibrillation Maternal Grandmother    Heart attack Other        maternal uncle had MI at age 40    Social History Social History   Tobacco Use   Smoking status: Some Days    Types: Cigars   Smokeless tobacco: Never  Vaping Use   Vaping Use: Never used  Substance Use Topics   Alcohol use: Yes    Comment: weekly   Drug use: Not Currently    Types: Marijuana     Allergies   Bee venom   Review of Systems Review of Systems Per HPI  Physical Exam Triage Vital Signs ED Triage Vitals  Enc Vitals Group     BP 02/20/21 1405 (!) 98/52     Pulse Rate 02/20/21 1405 68     Resp 02/20/21 1405 18     Temp 02/20/21 1405 97.8 F (36.6 C)     Temp Source 02/20/21 1405 Oral     SpO2 02/20/21 1405 98 %     Weight --      Height --      Head Circumference --  Peak Flow --      Pain Score 02/20/21 1415 0     Pain Loc --      Pain Edu? --      Excl. in GC? --    No data found.  Updated Vital Signs BP (!) 98/52 (BP Location: Left Arm)   Pulse 68   Temp 97.8 F (36.6 C) (Oral)   Resp 18   SpO2 98%   Visual Acuity Right Eye Distance:   Left Eye Distance:   Bilateral Distance:    Right Eye Near:   Left Eye Near:    Bilateral Near:     Physical Exam Constitutional:      Appearance: Normal appearance.  HENT:     Head: Normocephalic and atraumatic.  Eyes:     Extraocular Movements: Extraocular movements intact.     Conjunctiva/sclera: Conjunctivae normal.  Pulmonary:     Effort: Pulmonary effort is normal.  Genitourinary:    Comments: Deferred with shared decision making.  Self swab performed. Neurological:     General: No focal  deficit present.     Mental Status: He is alert and oriented to person, place, and time. Mental status is at baseline.  Psychiatric:        Mood and Affect: Mood normal.        Behavior: Behavior normal.        Thought Content: Thought content normal.        Judgment: Judgment normal.     UC Treatments / Results  Labs (all labs ordered are listed, but only abnormal results are displayed) Labs Reviewed  URINE CULTURE  POCT URINALYSIS DIP (MANUAL ENTRY)  CYTOLOGY, (ORAL, ANAL, URETHRAL) ANCILLARY ONLY    EKG   Radiology No results found.  Procedures Procedures (including critical care time)  Medications Ordered in UC Medications - No data to display  Initial Impression / Assessment and Plan / UC Course  I have reviewed the triage vital signs and the nursing notes.  Pertinent labs & imaging results that were available during my care of the patient were reviewed by me and considered in my medical decision making (see chart for details).     Advised patient that bacterial vaginosis is not sexually transmitted, and there is no risk for transmission of this.  Cytology swab pending per patient request.  Since patient did not complete antibiotic treatment for UTI, urinalysis and urine culture completed.  Urinalysis not showing signs of urinary tract infection.  Urine culture is pending.  Advised patient to contact pharmacy to pick up valacyclovir for treatment for genital herpes.  Patient to follow-up with urgent care if medication is not still present at pharmacy.  Advised patient of the importance of treatment for genital herpes.  Counseled patient on safe sex practices as well.Discussed strict return precautions. Patient verbalized understanding and is agreeable with plan.  Final Clinical Impressions(s) / UC Diagnoses   Final diagnoses:  Screening examination for venereal disease     Discharge Instructions      Your urine culture and penile swab are pending.  We will call  if these are positive.  Please consult with the pharmacy to see if valacyclovir medication is still at pharmacy.  You will need to take this medication.  Follow-up with urgent care if this medication is not there.  Refrain from any sexual activity until test results are complete.  Please use a condom when you are having sex.     ED Prescriptions   None  PDMP not reviewed this encounter.   Lance Muss, FNP 02/20/21 1537

## 2021-02-20 NOTE — ED Triage Notes (Signed)
Pt presents today for a STD testing. Pt denies sxs, noting that he's requesting testing because his partner was just tested. Pt was recently seen for STD tx and he reports that he did not complete the full course of prescribed meds.

## 2021-02-21 LAB — CYTOLOGY, (ORAL, ANAL, URETHRAL) ANCILLARY ONLY
Chlamydia: NEGATIVE
Comment: NEGATIVE
Comment: NEGATIVE
Comment: NORMAL
Neisseria Gonorrhea: NEGATIVE
Trichomonas: NEGATIVE

## 2021-02-21 LAB — URINE CULTURE: Culture: NO GROWTH

## 2021-06-25 ENCOUNTER — Encounter: Payer: Self-pay | Admitting: Emergency Medicine

## 2021-06-25 ENCOUNTER — Ambulatory Visit
Admission: EM | Admit: 2021-06-25 | Discharge: 2021-06-25 | Disposition: A | Discharge status: Home / Self Care | Payer: 59 | Attending: Internal Medicine | Admitting: Internal Medicine

## 2021-06-25 ENCOUNTER — Other Ambulatory Visit: Payer: Self-pay

## 2021-06-25 DIAGNOSIS — Z113 Encounter for screening for infections with a predominantly sexual mode of transmission: Secondary | ICD-10-CM | POA: Diagnosis not present

## 2021-06-25 NOTE — ED Provider Notes (Signed)
[name]    CSN: [id number] Arrival date & time: 06/25/21  0817      History   Chief Complaint Chief Complaint  Patient presents with   Exposure to STD    HPI Patrick [name] is a 21 y.o. male.   Patient presents for routine STD testing.  Denies any known exposure or symptoms.  Patient also requesting HIV and syphilis testing.   Exposure to STD   Past Medical History:  Diagnosis Date   Anxiety    Depressive disorder 10/10/2015    Patient Active Problem List   Diagnosis Date Noted   Intentional drug overdose ([place])    Acute hypoxemic respiratory failure ([place])    Acute respiratory insufficiency    MDD (major depressive disorder) 04/09/2019   New onset a-fib ([place]) 07/27/2018   Nausea & vomiting 07/27/2018   Intermittent explosive disorder 10/10/2015   Depressive disorder 10/10/2015   DMDD (disruptive mood dysregulation disorder) ([place]) 10/09/2015   Cannabis use disorder, moderate, dependence ([place]) 10/09/2015    Past Surgical History:  Procedure Laterality Date   HERNIA REPAIR         Home Medications    Prior to Admission medications   Medication Sig Start Date End Date Taking? Authorizing Provider  doxycycline (VIBRAMYCIN) 100 MG capsule Take 1 capsule (100 mg total) by mouth 2 (two) times daily. 01/31/21   Teodora Medici, FNP  methocarbamol (ROBAXIN) 500 MG tablet Take 1 tablet (500 mg total) by mouth 2 (two) times daily. 07/28/20   Volanda Napoleon, PA-C  naproxen (NAPROSYN) 375 MG tablet Take 1 tablet (375 mg total) by mouth 2 (two) times daily. 07/28/20   Volanda Napoleon, PA-C    Family History Family History  Problem Relation Age of Onset   Heart failure Maternal Grandmother    Atrial fibrillation Maternal Grandmother    Heart attack Other        maternal uncle had MI at age 64    Social History Social History   Tobacco Use   Smoking status: Some Days    Types: Cigars   Smokeless tobacco: Never  Vaping Use   Vaping Use: Never  used  Substance Use Topics   Alcohol use: Yes    Comment: weekly   Drug use: Not Currently    Types: Marijuana     Allergies   Bee venom   Review of Systems Review of Systems Per HPI  Physical Exam Triage Vital Signs ED Triage Vitals  Enc Vitals Group     BP 06/25/21 0857 107/69     Pulse Rate 06/25/21 0857 81     Resp 06/25/21 0857 18     Temp 06/25/21 0857 98.3 F (36.8 C)     Temp Source 06/25/21 0857 Oral     SpO2 06/25/21 0857 98 %     Weight 06/25/21 0858 140 lb (63.5 kg)     Height 06/25/21 0858 5\' 10"  (1.778 m)     Head Circumference --      Peak Flow --      Pain Score 06/25/21 0857 0     Pain Loc --      Pain Edu? --      Excl. in [place]? --    No data found.  Updated Vital Signs BP 107/69 (BP Location: Left Arm)    Pulse 81    Temp 98.3 F (36.8 C) (Oral)    Resp 18    Ht 5\' 10"  (1.778 m)  Wt 140 lb (63.5 kg)    SpO2 98%    BMI 20.09 kg/m   Visual Acuity Right Eye Distance:   Left Eye Distance:   Bilateral Distance:    Right Eye Near:   Left Eye Near:    Bilateral Near:     Physical Exam Constitutional:      General: He is not in acute distress.    Appearance: Normal appearance. He is not toxic-appearing or diaphoretic.  HENT:     Head: Normocephalic and atraumatic.  Eyes:     Extraocular Movements: Extraocular movements intact.     Conjunctiva/sclera: Conjunctivae normal.  Pulmonary:     Effort: Pulmonary effort is normal.  Genitourinary:    Comments: Deferred with shared decision making.  Self swab performed. Neurological:     General: No focal deficit present.     Mental Status: He is alert and oriented to person, place, and time. Mental status is at baseline.  Psychiatric:        Mood and Affect: Mood normal.        Behavior: Behavior normal.        Thought Content: Thought content normal.        Judgment: Judgment normal.     UC Treatments / Results  Labs (all labs ordered are listed, but only abnormal results are  displayed) Labs Reviewed  HIV ANTIBODY (ROUTINE TESTING W REFLEX)  RPR  CYTOLOGY, (ORAL, ANAL, URETHRAL) ANCILLARY ONLY    EKG   Radiology No results found.  Procedures Procedures (including critical care time)  Medications Ordered in UC Medications - No data to display  Initial Impression / Assessment and Plan / UC Course  I have reviewed the triage vital signs and the nursing notes.  Pertinent labs & imaging results that were available during my care of the patient were reviewed by me and considered in my medical decision making (see chart for details).     Routine STD testing completed per patient request.  Patient to refrain from sexual activity until test results and treatment are complete.  Patient verbalized understanding and was agreeable with plan. Final Clinical Impressions(s) / UC Diagnoses   Final diagnoses:  Screening examination for venereal disease     Discharge Instructions      Your STD tests are pending.  We will call if they are positive and treat as appropriate.    ED Prescriptions   None    PDMP not reviewed this encounter.   Teodora Medici, [place] 06/25/21 1031

## 2021-06-25 NOTE — ED Triage Notes (Signed)
Patient requesting STI testing including labs.  No sx's.

## 2021-06-25 NOTE — Discharge Instructions (Signed)
Your STD tests are pending.  We will call if they are positive and treat as appropriate. ?

## 2021-06-26 LAB — CYTOLOGY, (ORAL, ANAL, URETHRAL) ANCILLARY ONLY
Chlamydia: NEGATIVE
Comment: NEGATIVE
Comment: NEGATIVE
Comment: NORMAL
Neisseria Gonorrhea: NEGATIVE
Trichomonas: NEGATIVE

## 2021-06-26 LAB — HIV ANTIBODY (ROUTINE TESTING W REFLEX): HIV Screen 4th Generation wRfx: NONREACTIVE

## 2021-06-26 LAB — RPR: RPR Ser Ql: NONREACTIVE

## 2021-11-02 ENCOUNTER — Encounter (HOSPITAL_COMMUNITY): Payer: Self-pay | Admitting: Emergency Medicine

## 2021-11-02 ENCOUNTER — Ambulatory Visit (HOSPITAL_COMMUNITY)
Admission: EM | Admit: 2021-11-02 | Discharge: 2021-11-02 | Disposition: A | Discharge status: Home / Self Care | Payer: 59 | Attending: Physician Assistant | Admitting: Physician Assistant

## 2021-11-02 DIAGNOSIS — Z113 Encounter for screening for infections with a predominantly sexual mode of transmission: Secondary | ICD-10-CM | POA: Insufficient documentation

## 2021-11-02 DIAGNOSIS — Z202 Contact with and (suspected) exposure to infections with a predominantly sexual mode of transmission: Secondary | ICD-10-CM | POA: Diagnosis not present

## 2021-11-02 LAB — HEPATITIS C ANTIBODY: HCV Ab: NONREACTIVE

## 2021-11-02 LAB — HIV ANTIBODY (ROUTINE TESTING W REFLEX): HIV Screen 4th Generation wRfx: NONREACTIVE

## 2021-11-02 NOTE — Discharge Instructions (Signed)
We will contact you if any of your testing is positive you will need to arrange treatment.  Please abstain from sex until you receive these results.  If you are positive for any STIs you need to abstain from a minimum of 7 days after completing treatment (usually minimum of 2 weeks) and all partners need to be tested and treated as well.  If you develop any symptoms please return for reevaluation.

## 2021-11-02 NOTE — ED Triage Notes (Signed)
Patient presents for STI Testing.   Patient wants cytology and blood testing.   Patient denies any symptoms or exposures.

## 2021-11-02 NOTE — ED Provider Notes (Signed)
[facility]    CSN: [id number] Arrival date & time: 11/02/21  1442      History   Chief Complaint Chief Complaint  Patient presents with   STI Testing     HPI Patrick [name] is a 21 y.o. male.   Patient presents today requesting STI testing.  He denies any specific symptoms including penile discharge, dysuria, urinary frequency, urinary urgency, testicular pain.  He is sexually active with male partners and does not consistently use condoms.  He denies any recent antibiotics.  He is requesting complete STI panel including HIV/hepatitis/syphilis.  Denies any specific concerns or exposures.   Past Medical History:  Diagnosis Date   Anxiety    Depressive disorder 10/10/2015    Patient Active Problem List   Diagnosis Date Noted   Intentional drug overdose (HCC)    Acute hypoxemic respiratory failure (HCC)    Acute respiratory insufficiency    MDD (major depressive disorder) 04/09/2019   New onset a-fib (HCC) 07/27/2018   Nausea & vomiting 07/27/2018   Intermittent explosive disorder 10/10/2015   Depressive disorder 10/10/2015   DMDD (disruptive mood dysregulation disorder) (HCC) 10/09/2015   Cannabis use disorder, moderate, dependence (HCC) 10/09/2015    Past Surgical History:  Procedure Laterality Date   HERNIA REPAIR         Home Medications    Prior to Admission medications   Not on File    Family History Family History  Problem Relation Age of Onset   Heart failure Maternal Grandmother    Atrial fibrillation Maternal Grandmother    Heart attack Other        maternal uncle had MI at age 64    Social History Social History   Tobacco Use   Smoking status: Some Days    Types: Cigars   Smokeless tobacco: Never  Vaping Use   Vaping Use: Never used  Substance Use Topics   Alcohol use: Yes    Comment: weekly   Drug use: Not Currently    Types: Marijuana     Allergies   Bee venom   Review of Systems Review of Systems   Constitutional:  Positive for activity change. Negative for appetite change, fatigue and fever.  Gastrointestinal:  Negative for abdominal pain, diarrhea, nausea and vomiting.  Genitourinary:  Negative for dysuria, frequency, genital sores, penile discharge, penile pain, penile swelling and urgency.    Physical Exam Triage Vital Signs ED Triage Vitals  Enc Vitals Group     BP 11/02/21 1523 114/74     Pulse Rate 11/02/21 1523 95     Resp 11/02/21 1523 16     Temp 11/02/21 1523 98.4 F (36.9 C)     Temp Source 11/02/21 1523 Oral     SpO2 11/02/21 1523 97 %     Weight --      Height --      Head Circumference --      Peak Flow --      Pain Score 11/02/21 1526 0     Pain Loc --      Pain Edu? --      Excl. in GC? --    No data found.  Updated Vital Signs BP 114/74 (BP Location: Right Arm)   Pulse 95   Temp 98.4 F (36.9 C) (Oral)   Resp 16   SpO2 97%   Visual Acuity Right Eye Distance:   Left Eye Distance:   Bilateral Distance:    Right Eye Near:  Left Eye Near:    Bilateral Near:     Physical Exam Vitals reviewed.  Constitutional:      General: He is awake.     Appearance: Normal appearance. He is well-developed. He is not ill-appearing.     Comments: Very pleasant male appears stated age in no acute distress sitting comfortably in exam room  HENT:     Head: Normocephalic and atraumatic.     Mouth/Throat:     Pharynx: No oropharyngeal exudate, posterior oropharyngeal erythema or uvula swelling.  Cardiovascular:     Rate and Rhythm: Normal rate and regular rhythm.     Heart sounds: Normal heart sounds, S1 normal and S2 normal. No murmur heard. Pulmonary:     Effort: Pulmonary effort is normal.     Breath sounds: Normal breath sounds. No stridor. No wheezing, rhonchi or rales.     Comments: Clear to auscultation bilaterally Abdominal:     Palpations: Abdomen is soft.     Tenderness: There is no abdominal tenderness.  Genitourinary:    Comments: Exam  deferred Neurological:     Mental Status: He is alert.  Psychiatric:        Behavior: Behavior is cooperative.     UC Treatments / Results  Labs (all labs ordered are listed, but only abnormal results are displayed) Labs Reviewed  HIV ANTIBODY (ROUTINE TESTING W REFLEX)  RPR  HEPATITIS C ANTIBODY  CYTOLOGY, (ORAL, ANAL, URETHRAL) ANCILLARY ONLY    EKG   Radiology No results found.  Procedures Procedures (including critical care time)  Medications Ordered in UC Medications - No data to display  Initial Impression / Assessment and Plan / UC Course  I have reviewed the triage vital signs and the nursing notes.  Pertinent labs & imaging results that were available during my care of the patient were reviewed by me and considered in my medical decision making (see chart for details).     STI testing obtained today-results pending.  Patient was instructed to abstain from sex until results are available.  Discussed the importance of safe sex practices.  He is to return should he develop any symptoms in the future.  Recommended that he monitor his MyChart for these results but we will contact him if anything is positive we need to arrange treatment.  Final Clinical Impressions(s) / UC Diagnoses   Final diagnoses:  Routine screening for STI (sexually transmitted infection)     Discharge Instructions      We will contact you if any of your testing is positive you will need to arrange treatment.  Please abstain from sex until you receive these results.  If you are positive for any STIs you need to abstain from a minimum of 7 days after completing treatment (usually minimum of 2 weeks) and all partners need to be tested and treated as well.  If you develop any symptoms please return for reevaluation.     ED Prescriptions   None    PDMP not reviewed this encounter.   Jeani Hawking, PA-C 11/02/21 1545

## 2021-11-03 LAB — RPR: RPR Ser Ql: NONREACTIVE

## 2021-11-05 LAB — CYTOLOGY, (ORAL, ANAL, URETHRAL) ANCILLARY ONLY
Chlamydia: NEGATIVE
Comment: NEGATIVE
Comment: NEGATIVE
Comment: NORMAL
Neisseria Gonorrhea: NEGATIVE
Trichomonas: NEGATIVE

## 2022-02-14 ENCOUNTER — Ambulatory Visit (HOSPITAL_COMMUNITY)
Admission: EM | Admit: 2022-02-14 | Discharge: 2022-02-14 | Disposition: A | Discharge status: Home / Self Care | Payer: 59 | Attending: Family Medicine | Admitting: Family Medicine

## 2022-02-14 ENCOUNTER — Encounter (HOSPITAL_COMMUNITY): Payer: Self-pay

## 2022-02-14 DIAGNOSIS — T63441A Toxic effect of venom of bees, accidental (unintentional), initial encounter: Secondary | ICD-10-CM | POA: Diagnosis not present

## 2022-02-14 DIAGNOSIS — T7840XA Allergy, unspecified, initial encounter: Secondary | ICD-10-CM

## 2022-02-14 MED ORDER — METHYLPREDNISOLONE SODIUM SUCC 125 MG IJ SOLR
125.0000 mg | Freq: Once | INTRAMUSCULAR | Status: AC
  Administered 2022-02-14: 125 mg via INTRAVENOUS

## 2022-02-14 MED ORDER — SODIUM CHLORIDE 0.9 % IV SOLN: Freq: Once | INTRAVENOUS | Status: AC

## 2022-02-14 MED ORDER — PREDNISONE 20 MG PO TABS: 40.0000 mg | ORAL_TABLET | Freq: Every day | ORAL | 0 refills | Status: AC

## 2022-02-14 MED ORDER — METHYLPREDNISOLONE SODIUM SUCC 125 MG IJ SOLR
INTRAMUSCULAR | Status: AC
  Filled 2022-02-14: qty 2

## 2022-02-14 MED ORDER — EPINEPHRINE 0.3 MG/0.3ML IJ SOAJ: 0.3000 mg | INTRAMUSCULAR | 0 refills | Status: AC | PRN

## 2022-02-14 NOTE — Discharge Instructions (Signed)
Meds ordered this encounter  Medications   methylPREDNISolone sodium succinate (SOLU-MEDROL) 125 mg/2 mL injection 125 mg   0.9 %  sodium chloride infusion   predniSONE (DELTASONE) 20 MG tablet    Sig: Take 2 tablets (40 mg total) by mouth daily.    Dispense:  10 tablet    Refill:  0

## 2022-02-14 NOTE — ED Notes (Signed)
Complains of throat feeling full, tightening.  Patient is speaking in complete sentences.  Patient is calm

## 2022-02-14 NOTE — ED Triage Notes (Signed)
Pt has a allergy to bee sting .   Pt was stung by a bee today . Pt states he fells like something in his throat . Pt states he can swallow but, it feel funny like it may be closing. Pt was stung by a bee on left side of back

## 2022-02-14 NOTE — ED Provider Notes (Signed)
[facility]   [id number] 02/14/22 Arrival Time: 1301  ASSESSMENT & PLAN:  1. Allergic reaction, initial encounter   2. Bee sting, accidental or unintentional, initial encounter    No respiratory distress. Sensation of throat closing has resolved after IV solu-medrol. Is driving; Benadryl held. No epi needed. Comfortable with home observation. Begin PO prednisone.  Meds ordered this encounter  Medications   methylPREDNISolone sodium succinate (SOLU-MEDROL) 125 mg/2 mL injection 125 mg   0.9 %  sodium chloride infusion   predniSONE (DELTASONE) 20 MG tablet    Sig: Take 2 tablets (40 mg total) by mouth daily.    Dispense:  10 tablet    Refill:  0   EPINEPHrine 0.3 mg/0.3 mL IJ SOAJ injection    Sig: Inject 0.3 mg into the muscle as needed for anaphylaxis.    Dispense:  1 each    Refill:  0   Reviewed expectations re: course of current medical issues. Questions answered. Outlined signs and symptoms indicating need for more acute intervention. Patient verbalized understanding. After Visit Summary given.   SUBJECTIVE: History from: patient. Patrick [name] is a 21 y.o. male who presents for evaluation of a possible allergic reaction. Reports bee sting to back approx 30 min ago. Now with sensation of throat closing. No tx PTA. Reports childhood allergic reacion with "lip swelling". No CP/SOB/n/v reported. Has used EpiPen in past with similar. Needs refill.  OBJECTIVE:  Vitals:   02/14/22 1309 02/14/22 1311  BP:  116/70  Resp:  12  Temp:  98.7 F (37.1 C)  TempSrc:  Oral  SpO2:  98%  Weight: 59 kg   Height: 5\' 10"  (1.778 m)     General appearance: alert; no distress Eyes: PERRLA; EOMI; conjunctiva normal HENT: normocephalic; atraumatic; nasal mucosa normal; oral mucosa normal Neck: supple without swelling Lungs: clear to auscultation bilaterally; speaks full sentences without difficulty Heart: regular rate and rhythm Abdomen: soft, non-tender Back: no CVA  tenderness Extremities: no cyanosis or edema; symmetrical with no gross deformities Skin: warm and dry; no rashes Neurologic: normal gait; normal symmetric reflexes Psychological: alert and cooperative; normal mood and affect   Allergies  Allergen Reactions   Bee Venom Anaphylaxis    unknown    Past Medical History:  Diagnosis Date   Anxiety    Depressive disorder 10/10/2015   Social History   Socioeconomic History   Marital status: Single    Spouse name: Not on file   Number of children: Not on file   Years of education: Not on file   Highest education level: Not on file  Occupational History   Not on file  Tobacco Use   Smoking status: Some Days    Types: Cigars   Smokeless tobacco: Never  Vaping Use   Vaping Use: Never used  Substance and Sexual Activity   Alcohol use: Yes    Comment: weekly   Drug use: Not Currently    Types: Marijuana   Sexual activity: Yes    Birth control/protection: None  Other Topics Concern   Not on file  Social History Narrative   ** Merged History Encounter **       Social Determinants of Health   Financial Resource Strain: Not on file  Food Insecurity: Not on file  Transportation Needs: Not on file  Physical Activity: Not on file  Stress: Not on file  Social Connections: Not on file  Intimate Partner Violence: Not on file   Family History  Problem  Relation Age of Onset   Heart failure Maternal Grandmother    Atrial fibrillation Maternal Grandmother    Heart attack Other        maternal uncle had MI at age 43   Past Surgical History:  Procedure Laterality Date   HERNIA REPAIR       Mardella Layman, MD 02/14/22 [phone]

## 2022-04-10 ENCOUNTER — Ambulatory Visit (HOSPITAL_COMMUNITY)
Admission: EM | Admit: 2022-04-10 | Discharge: 2022-04-10 | Disposition: A | Discharge status: Home / Self Care | Payer: 59 | Attending: Emergency Medicine | Admitting: Emergency Medicine

## 2022-04-10 ENCOUNTER — Encounter (HOSPITAL_COMMUNITY): Payer: Self-pay | Admitting: Emergency Medicine

## 2022-04-10 DIAGNOSIS — Z113 Encounter for screening for infections with a predominantly sexual mode of transmission: Secondary | ICD-10-CM | POA: Insufficient documentation

## 2022-04-10 DIAGNOSIS — Z202 Contact with and (suspected) exposure to infections with a predominantly sexual mode of transmission: Secondary | ICD-10-CM

## 2022-04-10 NOTE — ED Triage Notes (Signed)
Pt presents for routine STD testing. Denies any current symptoms.

## 2022-04-10 NOTE — Discharge Instructions (Addendum)
We will call you if anything on your swab returns positive. Please abstain from sexual intercourse until your results return. 

## 2022-04-10 NOTE — ED Provider Notes (Signed)
[facility]    CSN: [id number] Arrival date & time: 04/10/22  1059     History   Chief Complaint Chief Complaint  Patient presents with   SEXUALLY TRANSMITTED DISEASE    HPI RENNY REMER is a 21 y.o. male.  Presents for STD testing Denies symptoms or known exposures No questions or concerns today  Past Medical History:  Diagnosis Date   Anxiety    Depressive disorder 10/10/2015    Patient Active Problem List   Diagnosis Date Noted   Intentional drug overdose (HCC)    Acute hypoxemic respiratory failure (HCC)    Acute respiratory insufficiency    MDD (major depressive disorder) 04/09/2019   New onset a-fib (HCC) 07/27/2018   Nausea & vomiting 07/27/2018   Intermittent explosive disorder 10/10/2015   Depressive disorder 10/10/2015   DMDD (disruptive mood dysregulation disorder) (HCC) 10/09/2015   Cannabis use disorder, moderate, dependence (HCC) 10/09/2015    Past Surgical History:  Procedure Laterality Date   HERNIA REPAIR         Home Medications    Prior to Admission medications   Medication Sig Start Date End Date Taking? Authorizing Provider  EPINEPHrine (ADRENALIN) 0.1 % nasal solution Place 1 drop into the nose once.    [provider]  EPINEPHrine 0.3 mg/0.3 mL IJ SOAJ injection Inject 0.3 mg into the muscle as needed for anaphylaxis. 02/14/22   Mardella Layman, MD  predniSONE (DELTASONE) 20 MG tablet Take 2 tablets (40 mg total) by mouth daily. 02/14/22   Mardella Layman, MD    Family History Family History  Problem Relation Age of Onset   Heart failure Maternal Grandmother    Atrial fibrillation Maternal Grandmother    Heart attack Other        maternal uncle had MI at age 21    Social History Social History   Tobacco Use   Smoking status: Some Days    Types: Cigars   Smokeless tobacco: Never  Vaping Use   Vaping Use: Never used  Substance Use Topics   Alcohol use: Yes    Comment: weekly   Drug use: Not Currently     Types: Marijuana     Allergies   Bee venom   Review of Systems Review of Systems Per HPI  Physical Exam Triage Vital Signs ED Triage Vitals  Enc Vitals Group     BP      Pulse      Resp      Temp      Temp src      SpO2      Weight      Height      Head Circumference      Peak Flow      Pain Score      Pain Loc      Pain Edu?      Excl. in GC?    No data found.  Updated Vital Signs BP 119/73 (BP Location: Left Arm)   Pulse 83   Temp 98.1 F (36.7 C) (Oral)   Resp 16   SpO2 98%    Physical Exam Vitals and nursing note reviewed.  Constitutional:      General: He is not in acute distress.    Appearance: Normal appearance.  HENT:     Mouth/Throat:     Pharynx: Oropharynx is clear.  Cardiovascular:     Rate and Rhythm: Normal rate and regular rhythm.  Pulmonary:     Effort:  Pulmonary effort is normal.  Neurological:     Mental Status: He is alert and oriented to person, place, and time.     UC Treatments / Results  Labs (all labs ordered are listed, but only abnormal results are displayed) Labs Reviewed  CYTOLOGY, (ORAL, ANAL, URETHRAL) ANCILLARY ONLY    EKG  Radiology No results found.  Procedures Procedures  Medications Ordered in UC Medications - No data to display  Initial Impression / Assessment and Plan / UC Course  I have reviewed the triage vital signs and the nursing notes.  Pertinent labs & imaging results that were available during my care of the patient were reviewed by me and considered in my medical decision making (see chart for details).  Cytology swab pending HIV/RPR done within past year, defer today Discussed safe sex practices Return precautions discussed. Patient agrees to plan  Final Clinical Impressions(s) / UC Diagnoses   Final diagnoses:  Screen for STD (sexually transmitted disease)     Discharge Instructions      We will call you if anything on your swab returns positive. Please abstain from sexual  intercourse until your results return.      ED Prescriptions   None    PDMP not reviewed this encounter.   Iokepa Geffre, Lurena Joiner, [place] 04/10/22 1253

## 2022-04-11 LAB — CYTOLOGY, (ORAL, ANAL, URETHRAL) ANCILLARY ONLY
Chlamydia: NEGATIVE
Comment: NEGATIVE
Comment: NEGATIVE
Comment: NORMAL
Neisseria Gonorrhea: NEGATIVE
Trichomonas: NEGATIVE

## 2022-04-30 ENCOUNTER — Encounter (HOSPITAL_COMMUNITY): Payer: Self-pay | Admitting: Emergency Medicine

## 2022-04-30 ENCOUNTER — Ambulatory Visit (HOSPITAL_COMMUNITY)
Admission: EM | Admit: 2022-04-30 | Discharge: 2022-04-30 | Disposition: A | Discharge status: Home / Self Care | Payer: 59 | Attending: Family Medicine | Admitting: Family Medicine

## 2022-04-30 DIAGNOSIS — N489 Disorder of penis, unspecified: Secondary | ICD-10-CM | POA: Diagnosis present

## 2022-04-30 DIAGNOSIS — Z7251 High risk heterosexual behavior: Secondary | ICD-10-CM | POA: Diagnosis present

## 2022-04-30 LAB — HIV ANTIBODY (ROUTINE TESTING W REFLEX): HIV Screen 4th Generation wRfx: NONREACTIVE

## 2022-04-30 NOTE — Discharge Instructions (Signed)
We have sent testing for STDs. We will notify you of any positive results once they are received. If required, we will prescribe any medications you might need.  Please refrain from all sexual activity for at least the next seven days.

## 2022-04-30 NOTE — ED Triage Notes (Signed)
Pt reports that he had a three-some recently. Reports that a Day or 2 afterwards started having bumps on shaft of penis that itch. Pt reports that one partner went to doctor and had a pelvic exam and was told that doesn't have herpes.  Pt reports that he did shave before the three-some.

## 2022-05-01 LAB — CYTOLOGY, (ORAL, ANAL, URETHRAL) ANCILLARY ONLY
Chlamydia: NEGATIVE
Comment: NEGATIVE
Comment: NEGATIVE
Comment: NORMAL
Neisseria Gonorrhea: NEGATIVE
Trichomonas: NEGATIVE

## 2022-05-01 LAB — RPR: RPR Ser Ql: NONREACTIVE

## 2022-05-01 NOTE — ED Provider Notes (Signed)
[facility]   [id number] 04/30/22 Arrival Time: [id number]  ASSESSMENT & PLAN:  1. High risk sexual behavior, unspecified type   2. Penile lesion    Irritated areas on penis do not look like genital herpes to me. Declines HSV culture. Will observe. Urethral cytology pending along with HIV/RPR.    Discharge Instructions      We have sent testing for STDs. We will notify you of any positive results once they are received. If required, we will prescribe any medications you might need.  Please refrain from all sexual activity for at least the next seven days.     Pending: Labs Reviewed  RPR  HIV ANTIBODY (ROUTINE TESTING W REFLEX)  CYTOLOGY, (ORAL, ANAL, URETHRAL) ANCILLARY ONLY    Will notify of any positive results. Instructed to refrain from sexual activity for at least seven days.  Reviewed expectations re: course of current medical issues. Questions answered. Outlined signs and symptoms indicating need for more acute intervention. Patient verbalized understanding. After Visit Summary given.   SUBJECTIVE:  Patrick [name] is a 21 y.o. male who reports having a three-some recently. Reports that a day or two afterwards started having non-painful bumps on shaft of penis that itch; improving. Pt reports that one partner went to doctor and had a pelvic exam and was told that she doesn't have herpes. No penile discharge. Afebrile. Pt reports that he did shave before the three-some.   OBJECTIVE:  Vitals:   04/30/22 1733  BP: 117/75  Pulse: 97  Resp: 14  Temp: 98.7 F (37.1 C)  TempSrc: Oral  SpO2: 99%    General appearance: alert, cooperative, appears stated age and no distress Throat: lips, mucosa, and tongue normal; teeth and gums normal Lungs: unlabored respirations; speaks full sentences without difficulty Back: no CVA tenderness; FROM at waist Abdomen: soft, non-tender GU: a few sub mm scabbed lesions over shaft of penis; no ulcerations Skin: warm and  dry Psychological: alert and cooperative; normal mood and affect.   Labs Reviewed  RPR  HIV ANTIBODY (ROUTINE TESTING W REFLEX)  CYTOLOGY, (ORAL, ANAL, URETHRAL) ANCILLARY ONLY    Allergies  Allergen Reactions   Bee Venom Anaphylaxis    unknown    Past Medical History:  Diagnosis Date   Anxiety    Depressive disorder 10/10/2015   Family History  Problem Relation Age of Onset   Heart failure Maternal Grandmother    Atrial fibrillation Maternal Grandmother    Heart attack Other        maternal uncle had MI at age 72   Social History   Socioeconomic History   Marital status: Single    Spouse name: Not on file   Number of children: Not on file   Years of education: Not on file   Highest education level: Not on file  Occupational History   Not on file  Tobacco Use   Smoking status: Some Days    Types: Cigars   Smokeless tobacco: Never  Vaping Use   Vaping Use: Never used  Substance and Sexual Activity   Alcohol use: Yes    Comment: weekly   Drug use: Not Currently    Types: Marijuana   Sexual activity: Yes    Birth control/protection: None  Other Topics Concern   Not on file  Social History Narrative   ** Merged History Encounter **       Social Determinants of Health   Financial Resource Strain: Not on file  Food Insecurity: Not  on file  Transportation Needs: Not on file  Physical Activity: Not on file  Stress: Not on file  Social Connections: Not on file  Intimate Partner Violence: Not on file           Mardella Layman, MD 05/01/22 1657

## 2022-07-02 ENCOUNTER — Emergency Department (HOSPITAL_COMMUNITY): Payer: 59

## 2022-07-02 ENCOUNTER — Other Ambulatory Visit: Payer: Self-pay

## 2022-07-02 ENCOUNTER — Emergency Department (HOSPITAL_COMMUNITY)
Admission: EM | Admit: 2022-07-02 | Discharge: 2022-07-02 | Payer: 59 | Attending: Emergency Medicine | Admitting: Emergency Medicine

## 2022-07-02 DIAGNOSIS — W228XXA Striking against or struck by other objects, initial encounter: Secondary | ICD-10-CM | POA: Insufficient documentation

## 2022-07-02 DIAGNOSIS — S0990XA Unspecified injury of head, initial encounter: Secondary | ICD-10-CM | POA: Diagnosis not present

## 2022-07-02 NOTE — ED Provider Notes (Signed)
[name] Note   CSN: [id number] Arrival date & time: 07/02/22  0259     History  Chief Complaint  Patient presents with   Eye pain     Patrick [name] is a 22 y.o. male.  Patient with noncontributory past medical history presents in police custody with concern for head injury.  He states that he was arrested around 1 AM this morning and hit the front of his head on something but is unsure what. He denies LOC. Not on anticoagulation. States that initially after he was light sensitive 'because the lights were bright.' States 'once I fully woke up my eyes stopped hurting, but my head still hurts.' States he had 1 shot of liquor tonight, denies any drug use. Of note, patient pending medical clearance to go to jail.   The history is provided by the patient. No language interpreter was used.       Home Medications Prior to Admission medications   Medication Sig Start Date End Date Taking? Authorizing Provider  EPINEPHrine (ADRENALIN) 0.1 % nasal solution Place 1 drop into the nose once.    [provider]  EPINEPHrine 0.3 mg/0.3 mL IJ SOAJ injection Inject 0.3 mg into the muscle as needed for anaphylaxis. 02/14/22   Vanessa Kick, MD  predniSONE (DELTASONE) 20 MG tablet Take 2 tablets (40 mg total) by mouth daily. 02/14/22   Vanessa Kick, MD      Allergies    Bee venom    Review of Systems   Review of Systems  Neurological:  Positive for headaches.  All other systems reviewed and are negative.   Physical Exam Updated Vital Signs BP 126/84 (BP Location: Right Arm)   Pulse 94   Temp 98.4 F (36.9 C) (Oral)   Resp 18   SpO2 98%  Physical Exam Vitals and nursing note reviewed.  Constitutional:      General: He is not in acute distress.    Appearance: Normal appearance. He is normal weight. He is not ill-appearing, toxic-appearing or diaphoretic.  HENT:     Head: Normocephalic and atraumatic.     Comments: No  Battle sign or raccoon eyes.  No wounds, bruising, or other signs of trauma to the head. Eyes:     Extraocular Movements: Extraocular movements intact.     Conjunctiva/sclera: Conjunctivae normal.     Pupils: Pupils are equal, round, and reactive to light.     Comments: Vision grossly intact  Neck:     Comments: No tenderness to palpation of cervical spine Cardiovascular:     Rate and Rhythm: Normal rate.  Pulmonary:     Effort: Pulmonary effort is normal. No respiratory distress.  Abdominal:     General: Abdomen is flat.     Palpations: Abdomen is soft.  Musculoskeletal:        General: Normal range of motion.     Cervical back: Normal range of motion and neck supple.     Comments: Ambulatory with steady gait.  Moving all extremities equally.  Skin:    General: Skin is warm and dry.  Neurological:     General: No focal deficit present.     Mental Status: He is alert and oriented to person, place, and time.  Psychiatric:        Mood and Affect: Mood normal.        Behavior: Behavior normal.     ED Results / Procedures / Treatments   Labs (  all labs ordered are listed, but only abnormal results are displayed) Labs Reviewed - No data to display  EKG None  Radiology CT Head Wo Contrast  Result Date: 07/02/2022 CLINICAL DATA:  Head trauma. EXAM: CT HEAD WITHOUT CONTRAST TECHNIQUE: Contiguous axial images were obtained from the base of the skull through the vertex without intravenous contrast. RADIATION DOSE REDUCTION: This exam was performed according to the departmental dose-optimization program which includes automated exposure control, adjustment of the mA and/or kV according to patient size and/or use of iterative reconstruction technique. COMPARISON:  10/27/2019. FINDINGS: Brain: No acute intracranial hemorrhage, midline shift or mass effect. No extra-axial fluid collection. Gray-white matter differentiation is within normal limits. No hydrocephalus. Vascular: No hyperdense  vessel or unexpected calcification. Skull: Normal. Negative for fracture or focal lesion. Sinuses/Orbits: Mucosal thickening is present in the left maxillary sinuses and ethmoid air cells bilaterally. No acute orbital abnormality. Other: None. IMPRESSION: No acute intracranial process. Electronically Signed   By: Brett Fairy M.D.   On: 07/02/2022 04:10    Procedures Procedures    Medications Ordered in ED Medications - No data to display  ED Course/ Medical Decision Making/ A&P                             Medical Decision Making Amount and/or Complexity of Data Reviewed Radiology: ordered.   Patient presents today for medical clearance to go to jail after head injury during police arrest.  Same occurred around 1 AM this evening.  Patient is afebrile, nontoxic-appearing, and in no acute distress with reassuring vital signs.  He is also alert and oriented and neurologically intact without focal deficits.  CT imaging obtained for further evaluation which is unremarkable for acute findings.  I have personally reviewed and interpreted this imaging and agree with radiology interpretation.  Very low suspicion for missed intracranial abnormality.  Patient is stable for discharge to jail.  He is understanding and amenable with plan, educated on red flag symptoms that would prompt immediate return.  Patient discharged in stable condition.   Final Clinical Impression(s) / ED Diagnoses Final diagnoses:  Injury of head, initial encounter    Rx / DC Orders ED Discharge Orders     None     An After Visit Summary was printed and given to the patient.     Bud Face, PA-C 07/02/22 0430    Merryl Hacker, MD 07/03/22 [phone]

## 2022-07-02 NOTE — ED Provider Triage Note (Signed)
Emergency Medicine Provider Triage Evaluation Note  Patrick [name] , a 22 y.o. male  was evaluated in triage.  Pt complains of headache. He states that his head started hurting when he was arrested this evening around 1 am. He arrives in police custody. States he hit his head on something, he is unsure what. States that originally his eyes were bothering him because 'the light was bright.' States 'once I woke up my eyes stopped hurting, but my head still hurts.' States that he doesn't think he lost consciousness but isnt sure. States he had 1 shot of alcohol to drink this evening, denies any drug use.   Review of Systems  Positive:  Negative:   Physical Exam  BP 126/84 (BP Location: Right Arm)   Pulse 94   Temp 98.4 F (36.9 C) (Oral)   Resp 18   SpO2 98%  Gen:   Awake, no distress   Resp:  Normal effort  MSK:   Moves extremities without difficulty  Other:  No signs of trauma to the head or face. PERRLA and EOMs intact. Alert and oriented  Medical Decision Making  Medically screening exam initiated at 3:32 AM.  Appropriate orders placed.  Patrick [name] was informed that the remainder of the evaluation will be completed by another provider, this initial triage assessment does not replace that evaluation, and the importance of remaining in the ED until their evaluation is complete.     Bud Face, PA-C 07/02/22 [phone]

## 2022-07-02 NOTE — Discharge Instructions (Signed)
As we discussed, your work-up in the ER was reassuring for acute findings.   Return if development of any new or worsening symptoms

## 2022-07-02 NOTE — ED Triage Notes (Signed)
Patient arrived with 2 [employer] officers reports bilateral eye pain this morning , denies injury/no vision loss.

## 2022-08-15 IMAGING — CR DG LUMBAR SPINE COMPLETE 4+V
5 series · 5 of 5 positions shown · non-contrast
Comparison: None.

CLINICAL DATA: Pain following recent fall

EXAM:
LUMBAR SPINE - COMPLETE 4+ VIEW

[t lumbar spine ap]
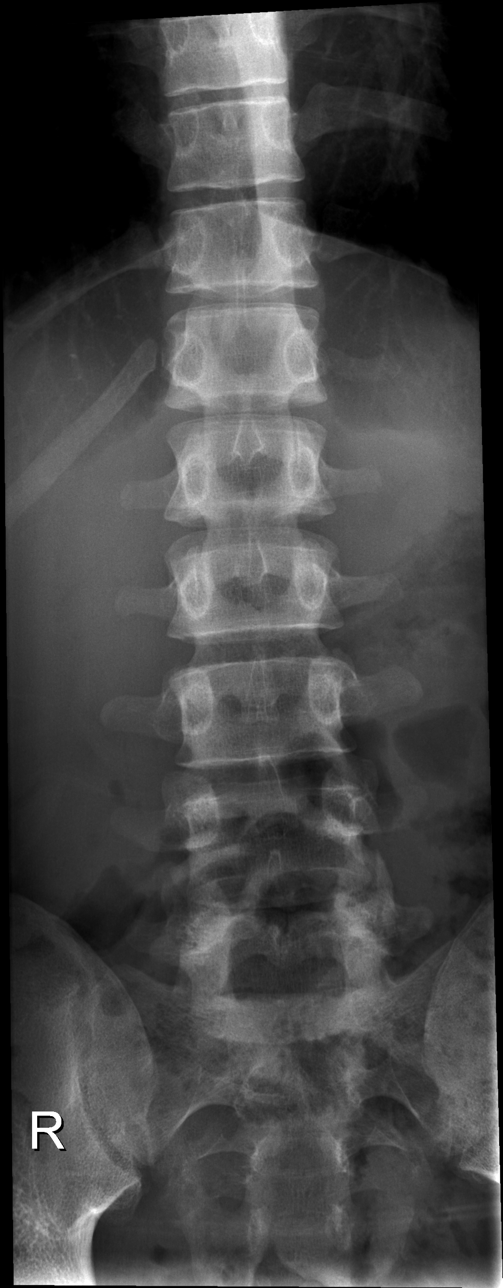

[t lumbar spine obl (1 of 2)]
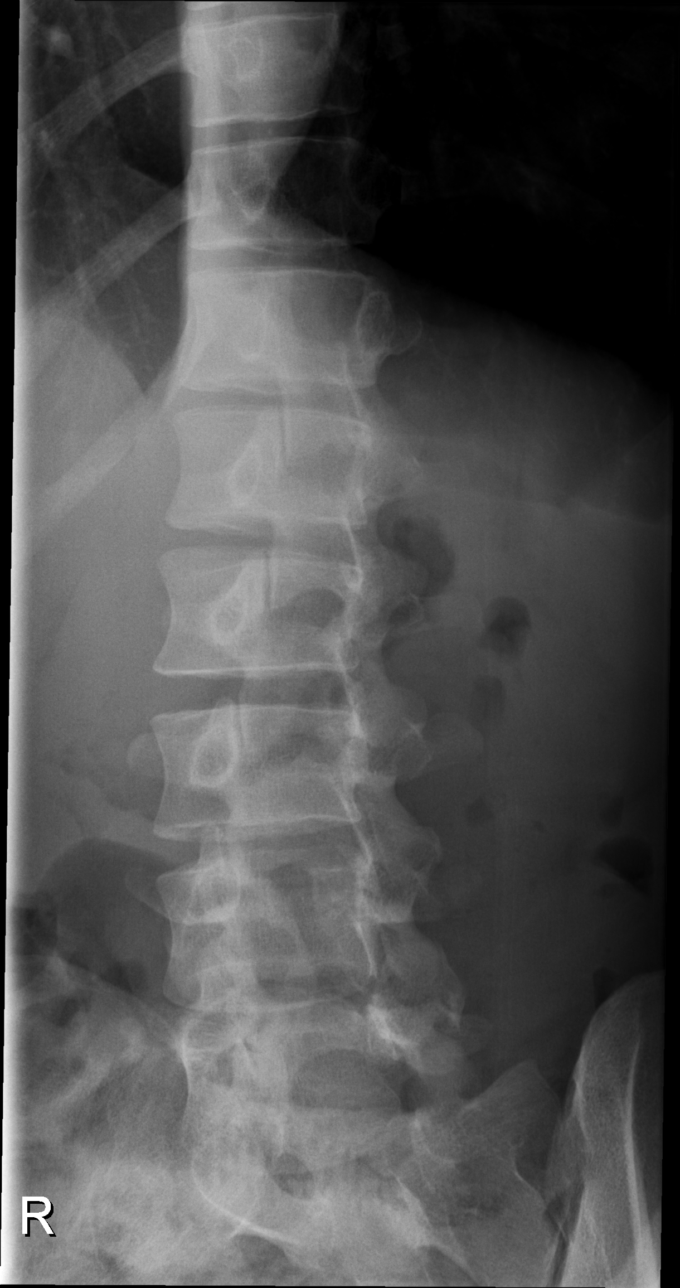

[t lumbar spine obl (2 of 2)]
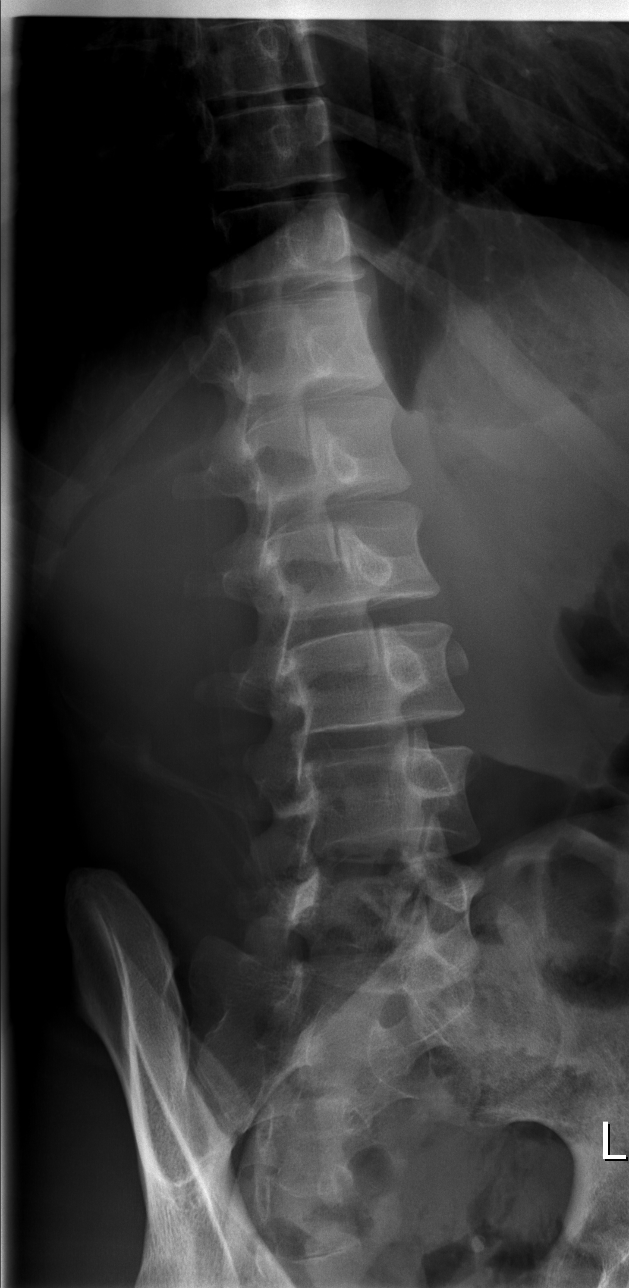

[t lumbar spine lat]
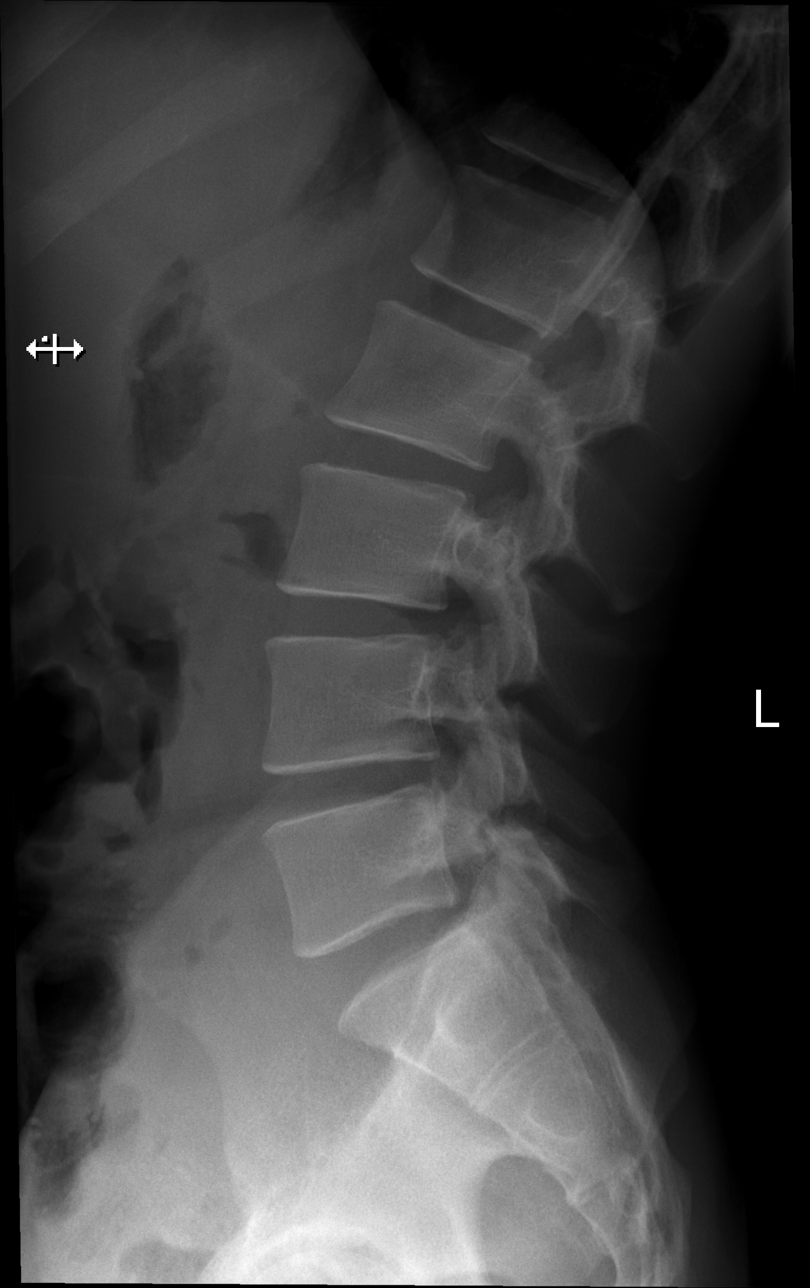

[t lumbar l-5 s-1 spot]
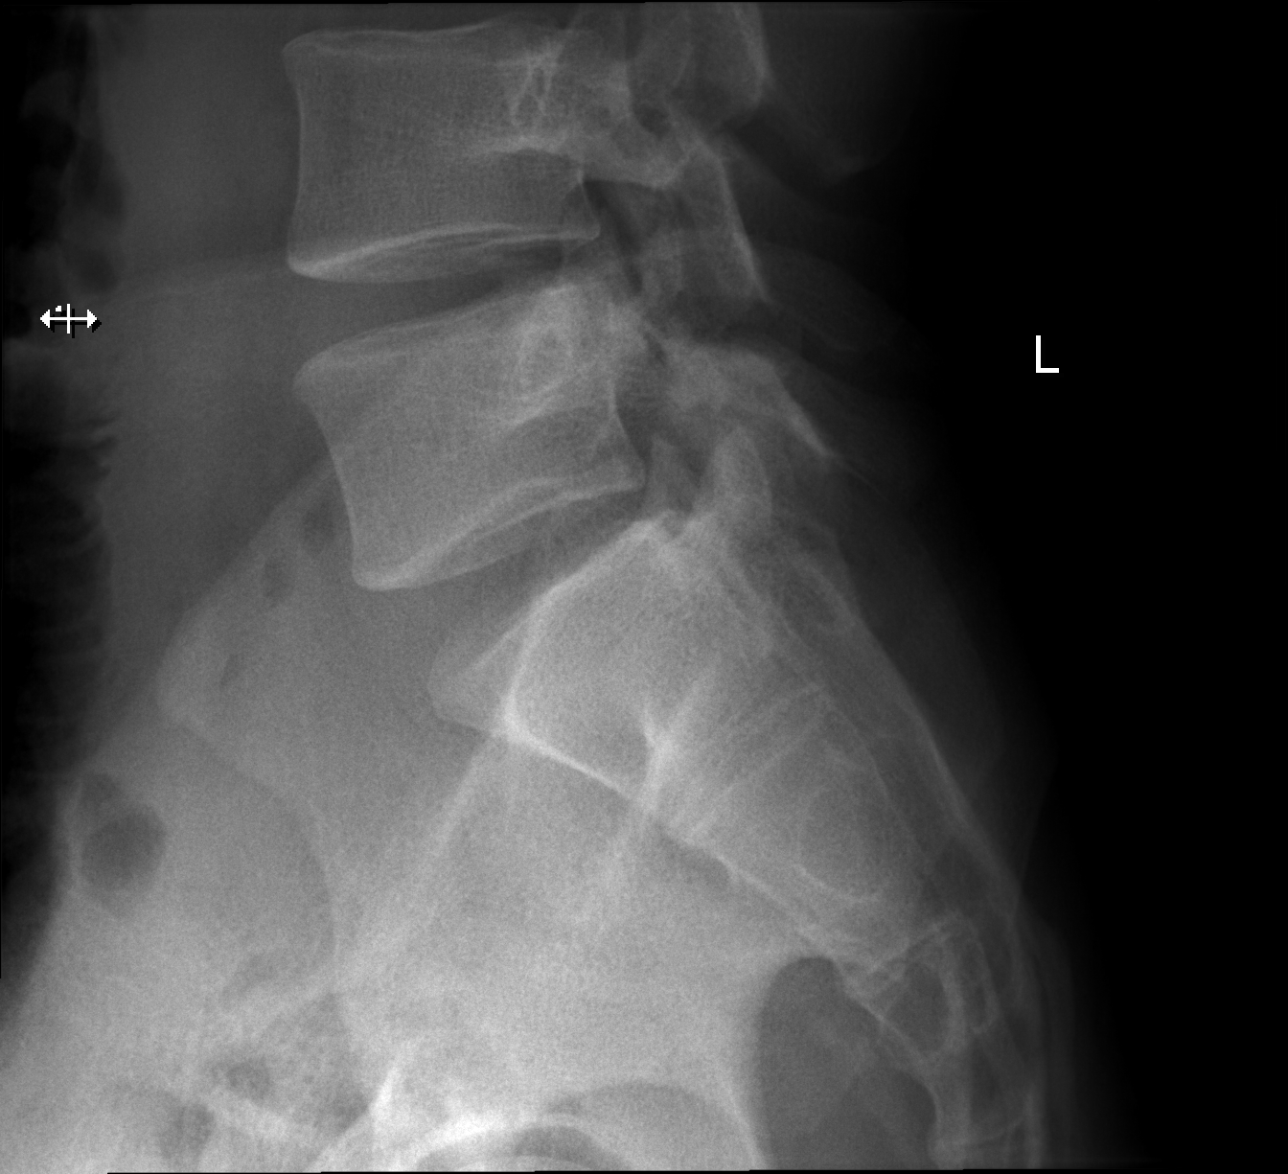

[5 of 5 positions shown; findings below may reference images not displayed]

FINDINGS: Frontal, lateral, spot lumbosacral lateral, and bilateral oblique
views were obtained. There are 5 non-rib-bearing lumbar type
vertebral bodies. Note that the left T12 rib is virtually aplastic.
There is no fracture or spondylolisthesis. The disc spaces appear
normal. There is no appreciable facet arthropathy.
IMPRESSION: No fracture or spondylolisthesis.  No appreciable arthropathy.
